# Patient Record
Sex: Male | Born: 1955 | Race: Black or African American | Hispanic: No | Smoking: Never smoker
Health system: Southern US, Community
[De-identification: ages and names within clinical notes are randomized; demographics above are authoritative.]

## PROBLEM LIST (undated history)

## (undated) DIAGNOSIS — E119 Type 2 diabetes mellitus without complications: Secondary | ICD-10-CM

## (undated) DIAGNOSIS — E785 Hyperlipidemia, unspecified: Secondary | ICD-10-CM

## (undated) DIAGNOSIS — I10 Essential (primary) hypertension: Secondary | ICD-10-CM

## (undated) DIAGNOSIS — I4891 Unspecified atrial fibrillation: Secondary | ICD-10-CM

## (undated) DIAGNOSIS — E669 Obesity, unspecified: Secondary | ICD-10-CM

## (undated) HISTORY — DX: Obesity, unspecified: E66.9

## (undated) HISTORY — PX: REPLACEMENT TOTAL KNEE: SUR1224

## (undated) HISTORY — DX: Essential (primary) hypertension: I10

---

## 2005-10-05 ENCOUNTER — Ambulatory Visit: Payer: Self-pay | Admitting: Gastroenterology

## 2006-05-01 ENCOUNTER — Ambulatory Visit: Payer: Self-pay | Admitting: Urology

## 2007-10-04 ENCOUNTER — Ambulatory Visit: Payer: Self-pay | Admitting: Urology

## 2007-10-25 ENCOUNTER — Ambulatory Visit: Payer: Self-pay | Admitting: Urology

## 2008-01-24 ENCOUNTER — Ambulatory Visit: Payer: Self-pay | Admitting: Urology

## 2008-03-21 ENCOUNTER — Encounter: Admission: RE | Admit: 2008-03-21 | Discharge: 2008-03-21 | Payer: Self-pay | Admitting: Orthopaedic Surgery

## 2008-04-11 ENCOUNTER — Ambulatory Visit: Payer: Self-pay | Admitting: Anesthesiology

## 2008-04-15 ENCOUNTER — Ambulatory Visit: Payer: Self-pay | Admitting: Anesthesiology

## 2008-06-19 ENCOUNTER — Ambulatory Visit: Payer: Self-pay | Admitting: Anesthesiology

## 2008-07-16 ENCOUNTER — Ambulatory Visit: Payer: Self-pay | Admitting: Anesthesiology

## 2008-08-29 ENCOUNTER — Ambulatory Visit: Payer: Self-pay | Admitting: Anesthesiology

## 2008-10-06 ENCOUNTER — Ambulatory Visit: Payer: Self-pay | Admitting: Unknown Physician Specialty

## 2008-10-08 ENCOUNTER — Ambulatory Visit: Payer: Self-pay | Admitting: Anesthesiology

## 2008-12-04 ENCOUNTER — Encounter: Payer: Self-pay | Admitting: Cardiovascular Disease

## 2008-12-05 ENCOUNTER — Ambulatory Visit: Payer: Self-pay | Admitting: Anesthesiology

## 2008-12-22 ENCOUNTER — Ambulatory Visit: Payer: Self-pay | Admitting: Cardiovascular Disease

## 2008-12-23 LAB — CONVERTED CEMR LAB
CO2: 21 meq/L (ref 19–32)
Calcium: 8.5 mg/dL (ref 8.4–10.5)
Chloride: 105 meq/L (ref 96–112)
Creatinine, Ser: 0.81 mg/dL (ref 0.40–1.50)
Hemoglobin: 14.1 g/dL (ref 13.0–17.0)
INR: 1.1 (ref 0.0–1.5)
Prothrombin Time: 14.3 s (ref 11.6–15.2)
RBC: 4.65 M/uL (ref 4.22–5.81)
Sodium: 137 meq/L (ref 135–145)

## 2008-12-25 ENCOUNTER — Ambulatory Visit: Payer: Self-pay | Admitting: Cardiovascular Disease

## 2008-12-25 ENCOUNTER — Ambulatory Visit (HOSPITAL_COMMUNITY): Admission: RE | Admit: 2008-12-25 | Discharge: 2008-12-25 | Payer: Self-pay | Admitting: Cardiovascular Disease

## 2009-07-22 ENCOUNTER — Ambulatory Visit: Payer: Self-pay | Admitting: Urology

## 2009-07-27 IMAGING — CR DG ABDOMEN 1V
1 series · 3 of 3 positions shown · non-contrast
Comparison: none

REASON FOR EXAM: nephrolithiasis pyuria pt need films
COMMENTS:

[Series 1: view not recorded · 0.17mm/px · 3 of 3 slices shown]
[im 1/3]
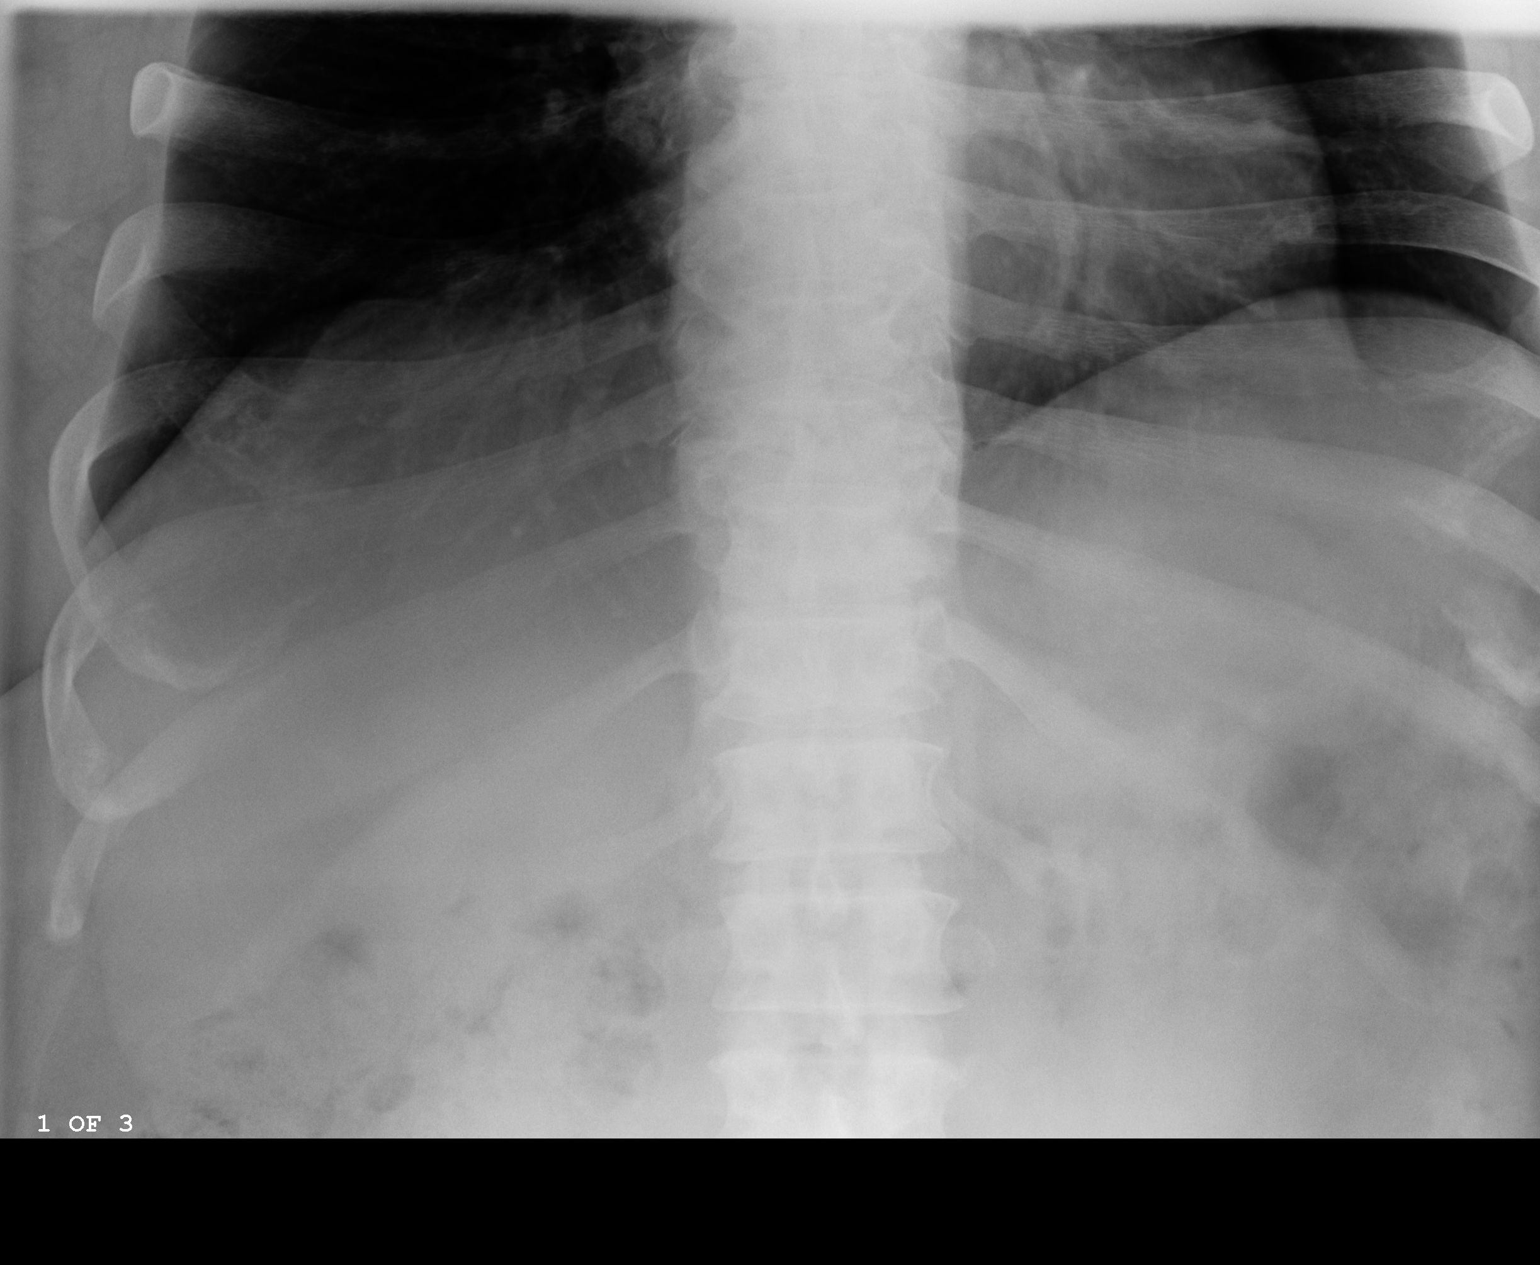
[im 2/3]
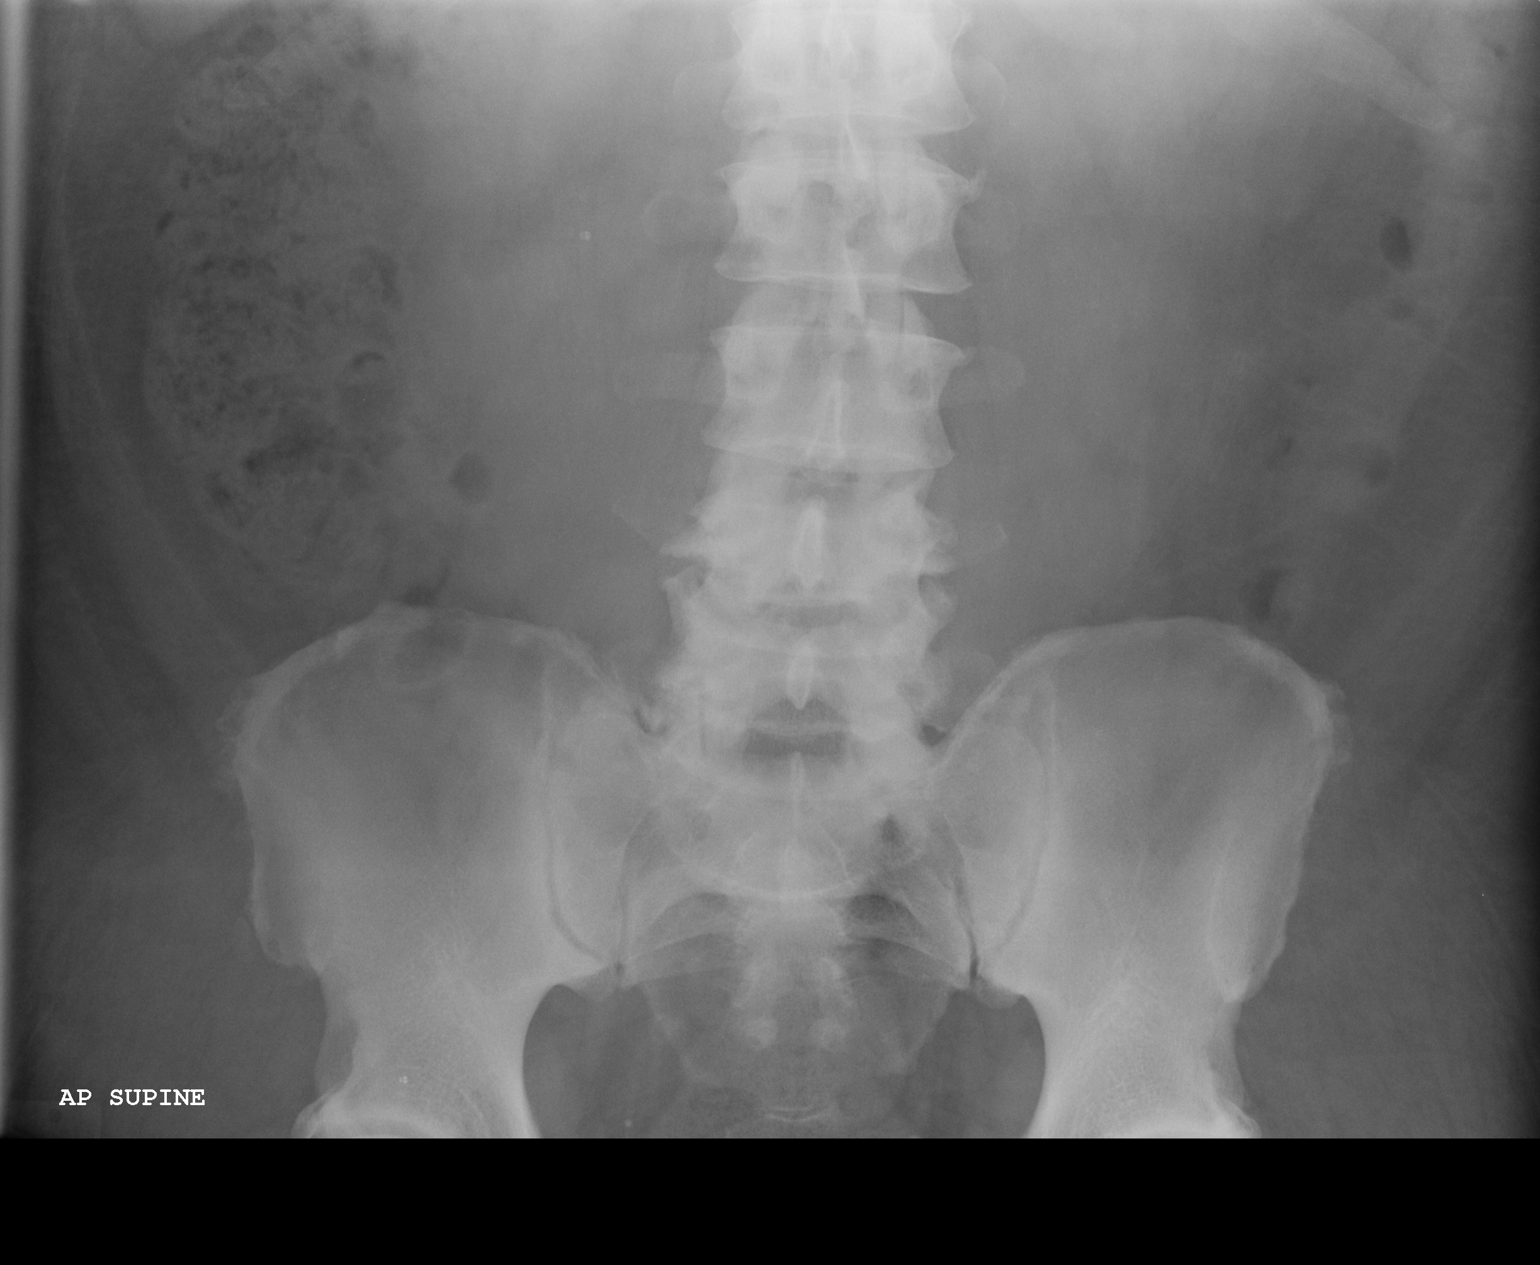
[im 3/3]
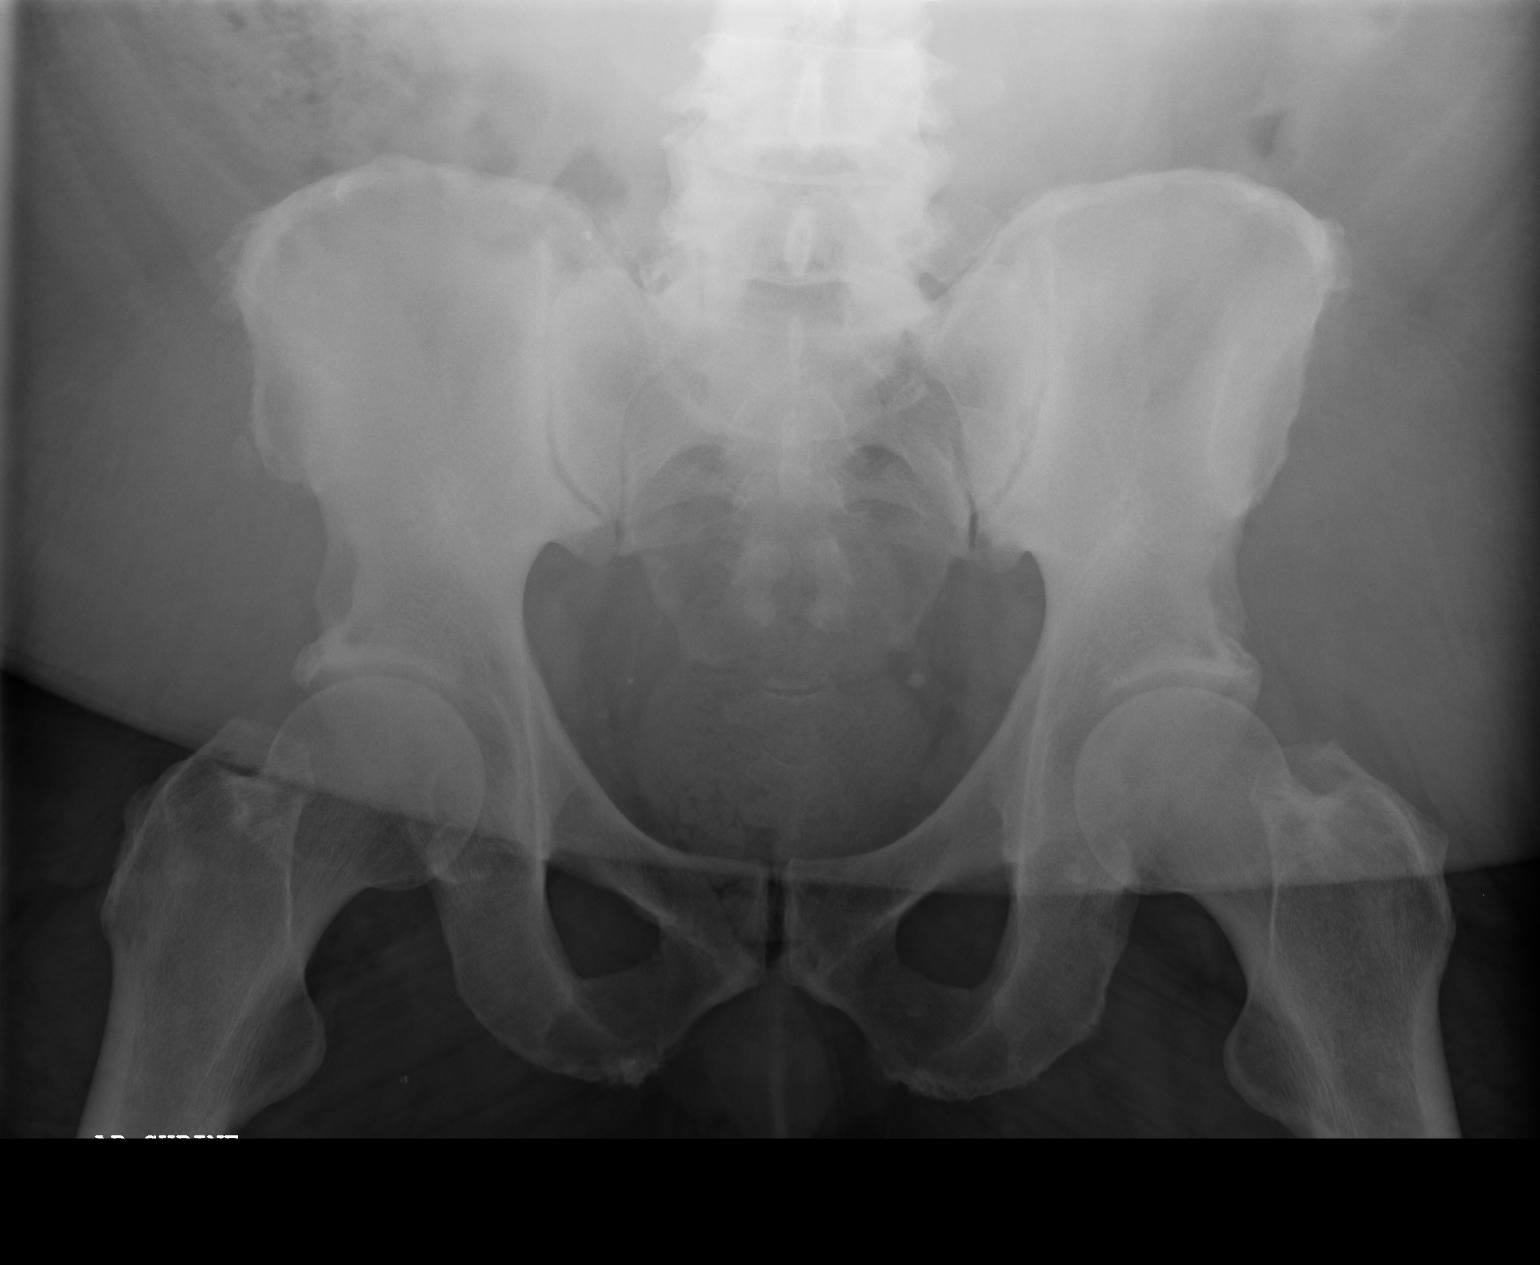

[3 of 3 positions shown; findings below may reference images not displayed]

PROCEDURE:     DXR - DXR KIDNEY URETER BLADDER  - October 04, 2007  [DATE]

RESULT:     There is a 2 mm calcific density projected at the level of the
L2 transverse process on the RIGHT.  The etiology for this density is
uncertain but a tiny ureteral stone cannot be totally excluded. There are
noted tiny calcifications in the pelvis bilaterally compatible with
phleboliths.  On plain film examination, no definite renal or ureteral
stones are seen.
IMPRESSION: 1.     No definite renal or ureteral stones are identified.
2.     There is a possible proximal RIGHT ureteral stone as mentioned above.

## 2009-09-21 ENCOUNTER — Ambulatory Visit: Payer: Self-pay | Admitting: Family Medicine

## 2010-05-18 ENCOUNTER — Ambulatory Visit: Payer: Self-pay | Admitting: Emergency Medicine

## 2010-07-28 ENCOUNTER — Ambulatory Visit: Payer: Self-pay | Admitting: Urology

## 2010-10-26 ENCOUNTER — Ambulatory Visit: Payer: Self-pay | Admitting: Specialist

## 2010-10-26 DIAGNOSIS — Z01810 Encounter for preprocedural cardiovascular examination: Secondary | ICD-10-CM

## 2010-10-28 ENCOUNTER — Ambulatory Visit: Payer: Self-pay | Admitting: Specialist

## 2010-11-08 ENCOUNTER — Ambulatory Visit: Payer: Self-pay | Admitting: Specialist

## 2010-11-14 ENCOUNTER — Ambulatory Visit: Payer: Self-pay | Admitting: Specialist

## 2010-11-15 ENCOUNTER — Ambulatory Visit: Payer: Self-pay | Admitting: Specialist

## 2010-12-28 NOTE — Letter (Signed)
Dec 22, 2008    Jacob Moss, MD  [facility]  [address], [address]  [place], [place] [address]   RE:  [name], [name]  MRN:  [id number]  /  DOB:  07-09-1956   Dear Dr. Dossie Arbour:   It was my pleasure to see Jacob Hicks on Dec 22, 2008, as an outpatient  for evaluation of chest pain.   Jacob Hicks is a 55 year old gentleman with no prior history of cardiac  disease.  He presents with a 35-month history of dizziness, diaphoresis,  and sharp chest pains that occur with walking.  His pain started out as  sharp and shooting.  They are in the middle of the chest with some  radiation to the back.  He then develops a tightness in the chest.  There is no radiation of pain to the jaw.  There is no associated nausea  or vomiting.  He reports profuse diaphoresis.  His symptoms resolve with  rest after approximately 5-15 minutes.  His symptoms are progressive  over a 47-month time period.  He also has a sensation that his heart is  racing.  He denies edema, orthopnea, PND, or syncope.   MEDICATIONS:  1. Benazepril 40 mg daily.  2. Hydrochlorothiazide 25 mg daily.  3. Ropinirole 25 mg t.i.d.  4. Gabapentin 300 mg t.i.d.  5. Allopurinol 300 mg daily.  6. Cyclobenzaprine 10 mg at bedtime.  7. Meloxicam 15 mg daily.  8. Fish oil 2 daily.  9. Mega Men vitamin 2 daily.  10.Aspirin 325 mg daily.   ALLERGIES:  NKDA.   PAST MEDICAL HISTORY:  1. Essential hypertension.  2. Gout.  3. Left knee arthroscopy in 2002.  4. Osteoarthritis.  5. Lumbar disk disease.   SOCIAL HISTORY:  The patient has been married for 32 years.  He has 2  grown children.  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  He has no  history of tobacco or alcohol use.  He does not participate in any  exercise.  He leads a sedentary lifestyle.   FAMILY HISTORY:  The patient's brother is deceased of a myocardial  infarction at  age 19.  There is no other coronary disease in the family.  There is no history of stroke in the family.  His brother also had end-  stage renal disease.   REVIEW OF SYSTEMS:  A complete 12-point review of systems was performed.  Pertinent positives included severe bilateral knee pain as well as low  back pain.  He reports that he will likely require bilateral knee  replacement as well as back surgery.  Review of systems also shows is  pertinent for a 45-pound weight loss.  The patient's chest pain has  actually been worse since he has lost weight by his report.  Positive  for seasonal allergies, chronic dyspnea, and leg pain with walking.  All  other systems are reviewed and are negative except as per the HPI.   PHYSICAL EXAMINATION:  GENERAL:  The patient is alert and oriented.  He  is morbidly obese.  VITAL SIGNS:  His weight is recorded at 415 pounds.  Blood pressure  128/78, heart rate 75, respiratory rate 16.  HEENT:  Normal.  NECK:  Normal carotid upstrokes.  No bruits.  JVP normal.  No  thyromegaly or thyroid nodules.  LUNGS:  Clear bilaterally.  HEART:  The apex is  not palpable.  The heart is regular rate and rhythm.  There are no murmurs or gallops.  BACK:  No CVA tenderness.  ABDOMEN:  Soft, nontender, obese.  No organomegaly.  No abdominal  bruits.  EXTREMITIES:  There is no clubbing, cyanosis, or edema.  Dorsalis pedis  pulses are 3+ bilaterally.  Posterior tibs are not palpable.  Radial  pulses are 2+ bilaterally.  There is normal Allen test in both hands  with radial artery occlusion.  SKIN:  Warm and dry without rash.  NEUROLOGIC:  Cranial nerves II through XII are intact.  Strength is  intact and equal.   EKG shows normal sinus rhythm and is within normal limits.   Lab data reviewed from December 04, 2008, shows a TSH of 1.3, triglycerides  75, potassium 3.8, platelet count 177,000, LDL 119, HDL 35, hemoglobin  14.5, glucose 109, creatinine 1.0   ASSESSMENT:  This  is a morbidly obese 55 year old gentleman with  progressive [place] Cardiovascular Society class III angina.  The  patient's symptoms are impressive and they are highly typical of angina.  Because of a high pretest probability and the inability to perform a  good stress test on the patient of this size, I would recommend  proceeding directly with cardiac catheterization.  I will likely use a  radial artery approach for access, because of his size.  The risks and  indications of the procedure were reviewed in detail with the patient.  He agrees to proceed.  This will be scheduled for later this week.  In  the interim, I would like him to continue with aspirin.  He was given a  prescription for nitroglycerin.  He should continue on his other  medications without change.  1. Hypertension.  Blood pressure under good control on a combination      of benazepril and hydrochlorothiazide.  We will assess his coronary      status in a few days and likely will start him on a beta-blocker at      that time.  2. Hyperlipidemia.  If he turns out to have coronary artery disease,      he will be above goal with an LDL greater than 100 and an HDL less      than 40.  We will further risk stratify based on his findings at      cardiac catheterization.   The patient is scheduled to have a colonoscopy for polyp surveillance in  2 days.  I spoke with his gastroenterologist, Dr. Servando Snare.  I this will be  deferred until we better assess his coronary status.  There is no  urgency for the colonoscopy since the recommendation is apparently 3-5  years surveillance.   Thank you again for asking me to see this very nice gentleman.  I will  be in contact at the time of his heart catheterization.    Sincerely,      Veverly Fells. Excell Seltzer, MD  Electronically Signed    MDC/MedQ  DD: 12/22/2008  DT: 12/23/2008  Job #: [id number]   CC:    Midge Minium, MD

## 2011-01-31 ENCOUNTER — Encounter: Payer: Self-pay | Admitting: Cardiovascular Disease

## 2012-05-08 ENCOUNTER — Encounter (HOSPITAL_COMMUNITY): Payer: Self-pay | Admitting: *Deleted

## 2012-05-08 ENCOUNTER — Emergency Department (HOSPITAL_COMMUNITY)
Admission: EM | Admit: 2012-05-08 | Discharge: 2012-05-09 | Disposition: A | Discharge status: Home / Self Care | Payer: Medicare Other | Attending: Emergency Medicine | Admitting: Emergency Medicine

## 2012-05-08 DIAGNOSIS — I10 Essential (primary) hypertension: Secondary | ICD-10-CM | POA: Insufficient documentation

## 2012-05-08 DIAGNOSIS — S61209A Unspecified open wound of unspecified finger without damage to nail, initial encounter: Secondary | ICD-10-CM | POA: Insufficient documentation

## 2012-05-08 DIAGNOSIS — S6990XA Unspecified injury of unspecified wrist, hand and finger(s), initial encounter: Secondary | ICD-10-CM

## 2012-05-08 DIAGNOSIS — Z8249 Family history of ischemic heart disease and other diseases of the circulatory system: Secondary | ICD-10-CM | POA: Insufficient documentation

## 2012-05-08 DIAGNOSIS — M109 Gout, unspecified: Secondary | ICD-10-CM | POA: Insufficient documentation

## 2012-05-08 DIAGNOSIS — Z800 Family history of malignant neoplasm of digestive organs: Secondary | ICD-10-CM | POA: Insufficient documentation

## 2012-05-08 MED ORDER — TETANUS-DIPHTH-ACELL PERTUSSIS 5-2.5-18.5 LF-MCG/0.5 IM SUSP
0.5000 mL | Freq: Once | INTRAMUSCULAR | Status: AC
  Administered 2012-05-09: 0.5 mL via INTRAMUSCULAR
  Filled 2012-05-08: qty 0.5

## 2012-05-08 NOTE — ED Notes (Signed)
NAD. Patient requesting tetanus vaccine

## 2012-05-08 NOTE — ED Notes (Signed)
Patient was reaching for something at the store and his finger hit the counter.  The counter injured his left thumb, small laceration to his nail.  Patient is requesting a tetatus shot

## 2012-05-08 NOTE — ED Notes (Signed)
End of left thumb with broken nail exposing nail bed. Cleaned and dressed

## 2012-05-09 NOTE — ED Provider Notes (Signed)
Medical screening examination/treatment/procedure(s) were performed by non-physician practitioner and as supervising physician I was immediately available for consultation/collaboration.    Jori Frerichs D Kadyn Guild, MD 05/09/12 1608 

## 2012-05-09 NOTE — ED Provider Notes (Signed)
History     CSN: [id number]  Arrival date & time 05/08/12  2103   First MD Initiated Contact with Patient 05/08/12 2228      Chief Complaint  Patient presents with  . Finger Injury   HPI  History provided by the patient. Patient is a 56 year old male with history of hypertension who presents with complaints of left thumb injury. Patient states he was at [employer] earlier today and was reaching for the counter to give change when he hit his left thumb against a sharp metal piece of the counter edge. This caused a small chip of his fingernail and bleeding under the nail. Patient was concerned because this was a metal object that was dirty in the restaurant and his tetanus shot is not up-to-date. He reports only a small amount of bleeding that was controlled with pressure and bandage. He denies any decreased range of motion or numbness to the thumb.   Past Medical History  Diagnosis Date  . Hypertension   . Gout   . Obesity     Morbid    History reviewed. No pertinent past surgical history.  Family History  Problem Relation Age of Onset  . Cancer Mother     Pancreatic  . Heart attack Brother   . Hypertension    . Other Brother     kidney/dialysis    History  Substance Use Topics  . Smoking status: Never Smoker   . Smokeless tobacco: Not on file  . Alcohol Use: No      Review of Systems  Neurological: Negative for weakness and numbness.    Allergies  Review of patient's allergies indicates no known allergies.  Home Medications   Current Outpatient Rx  Name Route Sig Dispense Refill  . ALLOPURINOL 300 MG PO TABS Oral Take 300 mg by mouth daily.      . ASPIRIN 325 MG PO TBEC Oral Take 325 mg by mouth daily.      Marland Kitchen BENAZEPRIL HCL 40 MG PO TABS Oral Take 40 mg by mouth daily.      Marland Kitchen GABAPENTIN 300 MG PO CAPS Oral Take 300 mg by mouth 2 (two) times daily.     . MELOXICAM 15 MG PO TABS Oral Take 15 mg by mouth daily.      Marland Kitchen MEGA MULTIVITAMIN FOR MEN PO TABS Oral  Take 1 tablet by mouth daily.     Marland Kitchen FISH OIL MAXIMUM STRENGTH 1200 MG PO CAPS Oral Take 1 capsule by mouth daily.       BP 141/73  Pulse 64  Temp 98.1 F (36.7 C) (Oral)  Resp 16  SpO2 97%  Physical Exam  Nursing note and vitals reviewed. Constitutional: He is oriented to person, place, and time. He appears well-developed and well-nourished. No distress.  HENT:  Head: Normocephalic.  Cardiovascular: Normal rate and regular rhythm.   Pulmonary/Chest: Effort normal and breath sounds normal. No respiratory distress.  Musculoskeletal: Normal range of motion. He exhibits no edema.       Small split of the left thumbnail with partial avulsion of. There is a small laceration to the distal tip of finger under the nail.  Neurological: He is alert and oriented to person, place, and time.  Skin: Skin is warm.  Psychiatric: He has a normal mood and affect. His behavior is normal.    ED Course  Procedures     1. Injury to thumb (nail)       MDM  12:00AM patient seen  and evaluated.        Angus Seller, [place] 05/09/12 [phone]

## 2017-08-10 DIAGNOSIS — M48061 Spinal stenosis, lumbar region without neurogenic claudication: Secondary | ICD-10-CM | POA: Insufficient documentation

## 2017-08-10 DIAGNOSIS — M47816 Spondylosis without myelopathy or radiculopathy, lumbar region: Secondary | ICD-10-CM | POA: Insufficient documentation

## 2017-08-10 DIAGNOSIS — G894 Chronic pain syndrome: Secondary | ICD-10-CM | POA: Insufficient documentation

## 2017-10-02 DIAGNOSIS — I480 Paroxysmal atrial fibrillation: Secondary | ICD-10-CM | POA: Insufficient documentation

## 2017-10-02 DIAGNOSIS — I10 Essential (primary) hypertension: Secondary | ICD-10-CM | POA: Insufficient documentation

## 2017-10-02 DIAGNOSIS — Z8249 Family history of ischemic heart disease and other diseases of the circulatory system: Secondary | ICD-10-CM | POA: Insufficient documentation

## 2018-05-07 DIAGNOSIS — M549 Dorsalgia, unspecified: Secondary | ICD-10-CM | POA: Insufficient documentation

## 2018-05-22 DIAGNOSIS — M545 Low back pain: Secondary | ICD-10-CM | POA: Diagnosis not present

## 2018-05-22 DIAGNOSIS — M1990 Unspecified osteoarthritis, unspecified site: Secondary | ICD-10-CM | POA: Diagnosis not present

## 2018-05-22 DIAGNOSIS — Z96612 Presence of left artificial shoulder joint: Secondary | ICD-10-CM | POA: Diagnosis not present

## 2018-05-22 DIAGNOSIS — M48061 Spinal stenosis, lumbar region without neurogenic claudication: Secondary | ICD-10-CM | POA: Diagnosis not present

## 2018-05-22 DIAGNOSIS — I10 Essential (primary) hypertension: Secondary | ICD-10-CM | POA: Diagnosis not present

## 2018-05-22 DIAGNOSIS — M47817 Spondylosis without myelopathy or radiculopathy, lumbosacral region: Secondary | ICD-10-CM | POA: Diagnosis not present

## 2018-05-22 DIAGNOSIS — Z96653 Presence of artificial knee joint, bilateral: Secondary | ICD-10-CM | POA: Diagnosis not present

## 2018-05-22 DIAGNOSIS — G894 Chronic pain syndrome: Secondary | ICD-10-CM | POA: Diagnosis not present

## 2018-06-04 DIAGNOSIS — M79604 Pain in right leg: Secondary | ICD-10-CM | POA: Diagnosis not present

## 2018-06-04 DIAGNOSIS — R0989 Other specified symptoms and signs involving the circulatory and respiratory systems: Secondary | ICD-10-CM | POA: Diagnosis not present

## 2018-06-04 DIAGNOSIS — I4891 Unspecified atrial fibrillation: Secondary | ICD-10-CM | POA: Diagnosis not present

## 2018-06-04 DIAGNOSIS — R000 Tachycardia, unspecified: Secondary | ICD-10-CM | POA: Diagnosis not present

## 2018-06-04 DIAGNOSIS — R200 Anesthesia of skin: Secondary | ICD-10-CM | POA: Diagnosis not present

## 2018-06-04 DIAGNOSIS — E7800 Pure hypercholesterolemia, unspecified: Secondary | ICD-10-CM | POA: Diagnosis not present

## 2018-06-04 DIAGNOSIS — I10 Essential (primary) hypertension: Secondary | ICD-10-CM | POA: Diagnosis not present

## 2018-06-04 DIAGNOSIS — Z7689 Persons encountering health services in other specified circumstances: Secondary | ICD-10-CM | POA: Diagnosis not present

## 2018-06-15 DIAGNOSIS — L600 Ingrowing nail: Secondary | ICD-10-CM | POA: Diagnosis not present

## 2018-07-16 DIAGNOSIS — G4700 Insomnia, unspecified: Secondary | ICD-10-CM | POA: Diagnosis not present

## 2018-07-16 DIAGNOSIS — Z6841 Body Mass Index (BMI) 40.0 and over, adult: Secondary | ICD-10-CM | POA: Diagnosis not present

## 2018-07-16 DIAGNOSIS — Z79899 Other long term (current) drug therapy: Secondary | ICD-10-CM | POA: Diagnosis not present

## 2018-07-16 DIAGNOSIS — Z7689 Persons encountering health services in other specified circumstances: Secondary | ICD-10-CM | POA: Diagnosis not present

## 2018-07-16 DIAGNOSIS — Z5181 Encounter for therapeutic drug level monitoring: Secondary | ICD-10-CM | POA: Diagnosis not present

## 2018-07-16 DIAGNOSIS — I70202 Unspecified atherosclerosis of native arteries of extremities, left leg: Secondary | ICD-10-CM | POA: Diagnosis not present

## 2018-07-16 DIAGNOSIS — I10 Essential (primary) hypertension: Secondary | ICD-10-CM | POA: Diagnosis not present

## 2018-07-16 DIAGNOSIS — R008 Other abnormalities of heart beat: Secondary | ICD-10-CM | POA: Diagnosis not present

## 2018-07-16 DIAGNOSIS — M48062 Spinal stenosis, lumbar region with neurogenic claudication: Secondary | ICD-10-CM | POA: Diagnosis not present

## 2018-07-19 ENCOUNTER — Encounter: Payer: Self-pay | Admitting: Family Medicine

## 2018-07-26 DIAGNOSIS — B079 Viral wart, unspecified: Secondary | ICD-10-CM | POA: Diagnosis not present

## 2018-08-17 DIAGNOSIS — Z1211 Encounter for screening for malignant neoplasm of colon: Secondary | ICD-10-CM | POA: Diagnosis not present

## 2018-09-03 ENCOUNTER — Ambulatory Visit: Payer: PPO | Admitting: Family Medicine

## 2018-09-03 DIAGNOSIS — Z0289 Encounter for other administrative examinations: Secondary | ICD-10-CM

## 2018-10-02 ENCOUNTER — Encounter: Payer: Self-pay | Admitting: Family Medicine

## 2018-10-25 ENCOUNTER — Other Ambulatory Visit: Payer: Self-pay | Admitting: Internal Medicine

## 2018-10-25 DIAGNOSIS — R319 Hematuria, unspecified: Secondary | ICD-10-CM

## 2018-10-31 ENCOUNTER — Other Ambulatory Visit: Payer: PPO

## 2018-11-06 ENCOUNTER — Inpatient Hospital Stay: Admission: RE | Admit: 2018-11-06 | Payer: Medicare Other | Source: Ambulatory Visit

## 2018-12-19 ENCOUNTER — Ambulatory Visit (HOSPITAL_COMMUNITY)
Admission: RE | Admit: 2018-12-19 | Discharge: 2018-12-19 | Disposition: A | Discharge status: Home / Self Care | Payer: Medicare Other | Source: Ambulatory Visit | Attending: Internal Medicine | Admitting: Internal Medicine

## 2018-12-19 ENCOUNTER — Other Ambulatory Visit: Payer: Self-pay

## 2018-12-19 DIAGNOSIS — R319 Hematuria, unspecified: Secondary | ICD-10-CM | POA: Insufficient documentation

## 2018-12-19 MED ORDER — IOHEXOL 300 MG/ML  SOLN
100.0000 mL | Freq: Once | INTRAMUSCULAR | Status: AC | PRN
  Administered 2018-12-19: 15:00:00 100 mL via INTRAVENOUS

## 2019-01-14 DEATH — deceased

## 2019-10-11 ENCOUNTER — Ambulatory Visit: Payer: Medicare Other | Attending: Internal Medicine

## 2019-10-11 DIAGNOSIS — Z23 Encounter for immunization: Secondary | ICD-10-CM | POA: Insufficient documentation

## 2019-10-11 NOTE — Progress Notes (Signed)
   Covid-19 Vaccination Clinic  Name:  Jacob Hicks    MRN: [id number] DOB: Mar 17, 1956  10/11/2019  [name] was observed post Covid-19 immunization for 15 minutes without incidence. He was provided with Vaccine Information Sheet and instruction to access the V-Safe system.   [name] was instructed to call 911 with any severe reactions post vaccine: Marland Kitchen Difficulty breathing  . Swelling of your face and throat  . A fast heartbeat  . A bad rash all over your body  . Dizziness and weakness    Immunizations Administered    Name Date Dose VIS Date Route   Pfizer COVID-19 Vaccine 10/11/2019 10:59 AM 0.3 mL 07/26/2019 Intramuscular   Manufacturer: [employer], [employer]   Lot: [id number]   NDC: [address]-7

## 2019-11-05 ENCOUNTER — Ambulatory Visit: Payer: Medicare Other | Attending: Internal Medicine

## 2019-11-05 DIAGNOSIS — Z23 Encounter for immunization: Secondary | ICD-10-CM

## 2019-11-05 NOTE — Progress Notes (Signed)
   Covid-19 Vaccination Clinic  Name:  KAYDIN KARBOWSKI    MRN: [id number] DOB: 1955/11/20  11/05/2019  Mr. Duross was observed post Covid-19 immunization for 15 minutes without incident. He was provided with Vaccine Information Sheet and instruction to access the V-Safe system.   Mr. Vanatta was instructed to call 911 with any severe reactions post vaccine: Marland Kitchen Difficulty breathing  . Swelling of face and throat  . A fast heartbeat  . A bad rash all over body  . Dizziness and weakness   Immunizations Administered    Name Date Dose VIS Date Route   Pfizer COVID-19 Vaccine 11/05/2019  1:03 PM 0.3 mL 07/26/2019 Intramuscular   Manufacturer: [employer], [employer]   Lot: [id number]   NDC: [address]-4

## 2020-11-10 ENCOUNTER — Other Ambulatory Visit: Payer: Self-pay | Admitting: Orthopaedic Surgery

## 2020-11-10 ENCOUNTER — Other Ambulatory Visit (HOSPITAL_COMMUNITY): Payer: Self-pay | Admitting: Orthopaedic Surgery

## 2020-11-10 DIAGNOSIS — M25562 Pain in left knee: Secondary | ICD-10-CM

## 2020-11-10 DIAGNOSIS — Z96652 Presence of left artificial knee joint: Secondary | ICD-10-CM

## 2020-11-18 ENCOUNTER — Other Ambulatory Visit: Payer: Self-pay

## 2020-11-18 ENCOUNTER — Encounter (HOSPITAL_COMMUNITY)
Admission: RE | Admit: 2020-11-18 | Discharge: 2020-11-18 | Disposition: A | Discharge status: Home / Self Care | Payer: Medicare HMO | Source: Ambulatory Visit | Attending: Orthopaedic Surgery | Admitting: Orthopaedic Surgery

## 2020-11-18 DIAGNOSIS — Z96652 Presence of left artificial knee joint: Secondary | ICD-10-CM | POA: Insufficient documentation

## 2020-11-18 DIAGNOSIS — M25562 Pain in left knee: Secondary | ICD-10-CM | POA: Insufficient documentation

## 2020-11-18 MED ORDER — TECHNETIUM TC 99M MEDRONATE IV KIT
20.0000 | PACK | Freq: Once | INTRAVENOUS | Status: AC | PRN
  Administered 2020-11-18: 20 via INTRAVENOUS

## 2021-01-13 ENCOUNTER — Other Ambulatory Visit: Payer: Self-pay | Admitting: Orthopaedic Surgery

## 2021-01-13 DIAGNOSIS — M5416 Radiculopathy, lumbar region: Secondary | ICD-10-CM

## 2021-01-23 ENCOUNTER — Other Ambulatory Visit: Payer: Self-pay

## 2021-01-23 ENCOUNTER — Ambulatory Visit
Admission: RE | Admit: 2021-01-23 | Discharge: 2021-01-23 | Disposition: A | Discharge status: Home / Self Care | Payer: Medicare HMO | Source: Ambulatory Visit | Attending: Orthopaedic Surgery | Admitting: Orthopaedic Surgery

## 2021-01-23 DIAGNOSIS — M5416 Radiculopathy, lumbar region: Secondary | ICD-10-CM

## 2021-08-06 ENCOUNTER — Emergency Department (HOSPITAL_COMMUNITY): Payer: Medicare HMO

## 2021-08-06 ENCOUNTER — Other Ambulatory Visit: Payer: Self-pay

## 2021-08-06 ENCOUNTER — Emergency Department (HOSPITAL_BASED_OUTPATIENT_CLINIC_OR_DEPARTMENT_OTHER)
Admission: EM | Admit: 2021-08-06 | Discharge: 2021-08-06 | Disposition: A | Discharge status: Home / Self Care | Payer: Medicare HMO | Attending: Emergency Medicine | Admitting: Emergency Medicine

## 2021-08-06 ENCOUNTER — Encounter (HOSPITAL_BASED_OUTPATIENT_CLINIC_OR_DEPARTMENT_OTHER): Payer: Self-pay

## 2021-08-06 ENCOUNTER — Emergency Department (HOSPITAL_BASED_OUTPATIENT_CLINIC_OR_DEPARTMENT_OTHER): Payer: Medicare HMO

## 2021-08-06 DIAGNOSIS — Z7982 Long term (current) use of aspirin: Secondary | ICD-10-CM | POA: Insufficient documentation

## 2021-08-06 DIAGNOSIS — Z79899 Other long term (current) drug therapy: Secondary | ICD-10-CM | POA: Diagnosis not present

## 2021-08-06 DIAGNOSIS — Z96653 Presence of artificial knee joint, bilateral: Secondary | ICD-10-CM | POA: Insufficient documentation

## 2021-08-06 DIAGNOSIS — E119 Type 2 diabetes mellitus without complications: Secondary | ICD-10-CM | POA: Diagnosis not present

## 2021-08-06 DIAGNOSIS — R42 Dizziness and giddiness: Secondary | ICD-10-CM | POA: Diagnosis not present

## 2021-08-06 DIAGNOSIS — R001 Bradycardia, unspecified: Secondary | ICD-10-CM | POA: Insufficient documentation

## 2021-08-06 DIAGNOSIS — I10 Essential (primary) hypertension: Secondary | ICD-10-CM | POA: Insufficient documentation

## 2021-08-06 HISTORY — DX: Type 2 diabetes mellitus without complications: E11.9

## 2021-08-06 HISTORY — DX: Hyperlipidemia, unspecified: E78.5

## 2021-08-06 HISTORY — DX: Unspecified atrial fibrillation: I48.91

## 2021-08-06 LAB — COMPREHENSIVE METABOLIC PANEL
ALT: 10 U/L (ref 0–44)
AST: 13 U/L — ABNORMAL LOW (ref 15–41)
Albumin: 3.5 g/dL (ref 3.5–5.0)
Alkaline Phosphatase: 66 U/L (ref 38–126)
Anion gap: 7 (ref 5–15)
BUN: 12 mg/dL (ref 8–23)
CO2: 27 mmol/L (ref 22–32)
Calcium: 8.8 mg/dL — ABNORMAL LOW (ref 8.9–10.3)
Chloride: 105 mmol/L (ref 98–111)
Creatinine, Ser: 0.79 mg/dL (ref 0.61–1.24)
GFR, Estimated: >60 mL/min (ref >60)
Glucose, Bld: 93 mg/dL (ref 70–99)
Potassium: 3.9 mmol/L (ref 3.5–5.1)
Sodium: 139 mmol/L (ref 135–145)
Total Bilirubin: 0.4 mg/dL (ref 0.3–1.2)
Total Protein: 7.1 g/dL (ref 6.5–8.1)

## 2021-08-06 LAB — CBC WITH DIFFERENTIAL/PLATELET
Abs Immature Granulocytes: 0.01 10*3/uL (ref 0.00–0.07)
Basophils Absolute: 0 10*3/uL (ref 0.0–0.1)
Basophils Relative: 1 %
Eosinophils Absolute: 0.1 10*3/uL (ref 0.0–0.5)
Eosinophils Relative: 2 %
HCT: 40.8 % (ref 39.0–52.0)
Hemoglobin: 13.8 g/dL (ref 13.0–17.0)
Immature Granulocytes: 0 %
Lymphocytes Relative: 21 %
Lymphs Abs: 1.3 10*3/uL (ref 0.7–4.0)
MCH: 30.9 pg (ref 26.0–34.0)
MCHC: 33.8 g/dL (ref 30.0–36.0)
MCV: 91.5 fL (ref 80.0–100.0)
Monocytes Absolute: 0.4 10*3/uL (ref 0.1–1.0)
Monocytes Relative: 7 %
Neutro Abs: 4.5 10*3/uL (ref 1.7–7.7)
Neutrophils Relative %: 69 %
Platelets: 132 10*3/uL — ABNORMAL LOW (ref 150–400)
RBC: 4.46 MIL/uL (ref 4.22–5.81)
RDW: 15.1 % (ref 11.5–15.5)
WBC: 6.4 10*3/uL (ref 4.0–10.5)
nRBC: 0 % (ref 0.0–0.2)

## 2021-08-06 MED ORDER — MECLIZINE HCL 25 MG PO TABS
25.0000 mg | ORAL_TABLET | Freq: Once | ORAL | Status: AC
  Administered 2021-08-06: 17:00:00 25 mg via ORAL
  Filled 2021-08-06: qty 1

## 2021-08-06 NOTE — ED Provider Notes (Signed)
[facility] EMERGENCY DEPARTMENT Provider Note   CSN: [id number] Arrival date & time: 08/06/21  1329     History Chief Complaint  Patient presents with   Nausea    Jacob Hicks is a 65 y.o. male with history of atrial fibrillation anticoagulated with Eliquis and hypertension who presents to the emergency department with dizziness that began upon waking up this morning.  Patient describes his dizziness as a lightheaded sensation which has been constant but improving since onset.  Patient does state that he feels unsteady on his feet.  Patient has not taken anything for his dizziness.  It is worse with certain positional movements and with walking. He has not missed any doses of his Eliquis.  He denies any trouble speaking, trouble swallowing, weakness/numbness to his upper or lower extremities, facial droop, chest pain, shortness of breath, abdominal pain, nausea, vomiting, diarrhea.  No urinary complaints.  Patient was seen and evaluated at [facility] emergency department where he had basic labs and a CT head which is negative.  He was then transferred to [facility] for further evaluation and MRI of the brain to rule out possible stroke.  Patient states he is currently feeling better.  HPI     Past Medical History:  Diagnosis Date   Atrial fibrillation (HCC)    Diabetes mellitus without complication (HCC)    Gout    Hyperlipemia    Hypertension    Obesity    Morbid    There are no problems to display for this patient.   Past Surgical History:  Procedure Laterality Date   REPLACEMENT TOTAL KNEE Bilateral        Family History  Problem Relation Age of Onset   Cancer Mother        Pancreatic   Heart attack Brother    Hypertension Other    Other Brother        kidney/dialysis    Social History   Tobacco Use   Smoking status: Never  Substance Use Topics   Alcohol use: No    Home Medications Prior to Admission medications    Medication Sig Start Date End Date Taking? Authorizing Provider  allopurinol (ZYLOPRIM) 300 MG tablet Take 300 mg by mouth daily.      [provider]  aspirin 325 MG EC tablet Take 325 mg by mouth daily.      [provider]  benazepril (LOTENSIN) 40 MG tablet Take 40 mg by mouth daily.      [provider]  gabapentin (NEURONTIN) 300 MG capsule Take 300 mg by mouth 2 (two) times daily.     [provider]  meloxicam (MOBIC) 15 MG tablet Take 15 mg by mouth daily.      [provider]  Multiple Vitamins-Minerals (MEGA MULTIVITAMIN FOR MEN) TABS Take 1 tablet by mouth daily.     [provider]  Omega-3 Fatty Acids (FISH OIL MAXIMUM STRENGTH) 1200 MG CAPS Take 1 capsule by mouth daily.     [provider]    Allergies    Patient has no known allergies.  Review of Systems   Review of Systems  All other systems reviewed and are negative.  Physical Exam Updated Vital Signs BP 121/70    Pulse (!) 45    Temp 98 F (36.7 C) (Oral)    Resp 17    Ht 6\' 1"  (1.854 m)    Wt (!) 163.3 kg    SpO2 100%  BMI 47.50 kg/m   Physical Exam Vitals and nursing note reviewed.  Constitutional:      General: He is not in acute distress.    Appearance: Normal appearance.  HENT:     Head: Normocephalic and atraumatic.  Eyes:     General:        Right eye: No discharge.        Left eye: No discharge.     Extraocular Movements: Extraocular movements intact.     Right eye: Normal extraocular motion and no nystagmus.     Left eye: Normal extraocular motion and no nystagmus.  Cardiovascular:     Comments: Bradycardic. S1/S2 are distinct without any evidence of murmur, rubs, or gallops.  Radial pulses are 2+ bilaterally.  Dorsalis pedis pulses are 2+ bilaterally.  No evidence of pedal edema. Pulmonary:     Comments: Clear to auscultation bilaterally.  Normal effort.  No respiratory distress.  No evidence of wheezes, rales, or rhonchi heard  throughout. Abdominal:     General: Abdomen is flat. Bowel sounds are normal. There is no distension.     Tenderness: There is no abdominal tenderness. There is no guarding or rebound.  Musculoskeletal:        General: Normal range of motion.     Cervical back: Neck supple.  Skin:    General: Skin is warm and dry.     Findings: No rash.  Neurological:     General: No focal deficit present.     Mental Status: He is alert.     GCS: GCS eye subscore is 4. GCS verbal subscore is 5. GCS motor subscore is 6.     Comments: Cranial nerves II through XII are intact.  5/5 strength to the upper and lower extremities.  Normal sensation to the upper and lower extremities.  No dysmetria on finger-to-nose.  Negative Romberg.  No pronator drift.  Patient did seem a little unsteady on his feet while I tested his gait.  Patient does state this is his baseline.  Psychiatric:        Mood and Affect: Mood normal.        Behavior: Behavior normal.    ED Results / Procedures / Treatments   Labs (all labs ordered are listed, but only abnormal results are displayed) Labs Reviewed  CBC WITH DIFFERENTIAL/PLATELET - Abnormal; Notable for the following components:      Result Value   Platelets 132 (*)    All other components within normal limits  COMPREHENSIVE METABOLIC PANEL - Abnormal; Notable for the following components:   Calcium 8.8 (*)    AST 13 (*)    All other components within normal limits    EKG EKG Interpretation  Date/Time:  Friday August 06 2021 16:39:58 EST Ventricular Rate:  51 PR Interval:  164 QRS Duration: 106 QT Interval:  458 QTC Calculation: 422 R Axis:   -5 Text Interpretation: Sinus rhythm Borderline T abnormalities, inferior leads No significant change since last tracing Confirmed by Vanetta Mulders [phone]) on 08/06/2021 4:46:35 PM  Radiology CT Head Wo Contrast  Result Date: 08/06/2021 CLINICAL DATA:  Nausea low energy level EXAM: CT HEAD WITHOUT CONTRAST TECHNIQUE:  Contiguous axial images were obtained from the base of the skull through the vertex without intravenous contrast. COMPARISON:  CT brain report 12/03/1999 FINDINGS: Brain: No acute territorial infarction, hemorrhage or intracranial mass. The ventricles are nonenlarged. Vascular: No hyperdense vessels. Vertebral and carotid vascular calcification Skull: Normal. Negative for fracture or focal lesion.  Sinuses/Orbits: No acute finding. Other: None IMPRESSION: Negative non contrasted CT appearance of the brain Electronically Signed   By: Jasmine Pang M.D.   On: 08/06/2021 17:01   MR Brain Wo Contrast (neuro protocol)  Result Date: 08/06/2021 CLINICAL DATA:  Acute neurologic deficit EXAM: MRI HEAD WITHOUT CONTRAST TECHNIQUE: Multiplanar, multiecho pulse sequences of the brain and surrounding structures were obtained without intravenous contrast. COMPARISON:  None. FINDINGS: Brain: Punctate focus of hyperintensity on diffusion-weighted imaging in the medial right temporal lobe. No clear ADC correlate. No other diffusion abnormality. No acute or chronic hemorrhage. Normal white matter signal, parenchymal volume and CSF spaces. The midline structures are normal. Vascular: Major flow voids are preserved. Skull and upper cervical spine: Normal calvarium and skull base. Visualized upper cervical spine and soft tissues are normal. Sinuses/Orbits:No paranasal sinus fluid levels or advanced mucosal thickening. No mastoid or middle ear effusion. Normal orbits. IMPRESSION: 1. Punctate focus of hyperintensity on diffusion-weighted imaging in the medial right temporal lobe without clear ADC correlate. This may be a small focus of subacute ischemia. 2. Otherwise normal brain MRI. Electronically Signed   By: Deatra Robinson M.D.   On: 08/06/2021 22:30    Procedures Procedures   Medications Ordered in ED Medications  meclizine (ANTIVERT) tablet 25 mg (25 mg Oral Given 08/06/21 1707)    ED Course  I have reviewed the triage  vital signs and the nursing notes.  Pertinent labs & imaging results that were available during my care of the patient were reviewed by me and considered in my medical decision making (see chart for details).  Clinical Course as of 08/06/21 4098  Fri Aug 06, 2021  2249 Spoke with Dr. Derry Lory who recommends follow-up in the outpatient setting.  The findings on the MRI do not correlate with the patient's symptoms.  This is likely an incidental finding.  He recommends close follow-up in the outpatient setting and to continue his anticoagulation. [CF]    Clinical Course User Index [CF] Jolyn Lent   MDM Rules/Calculators/A&P                          AMAAR OSHITA is a 65 y.o. male who presents to the emergency department for rule out stroke.  CT head and basic labs were ordered prior to the patient's arrival to [facility] emergency department.  On my exam, patient has a relatively normal neurological exam but given his risk factors we will proceed with MRI of the brain.  Patient states that he currently feels back to his baseline  Labs were unremarkable.  CT head was unremarkable.  MRI did show a small possible ischemic change in the right temporal lobe.  Spoke with neurology who does not believe this is related to his symptoms.  At this point it is unclear what exactly is causing his dizziness but patient is currently returned to his baseline.  I will have him follow-up with neurology in the outpatient setting.  Strict turn precautions given.  He is safe for discharge.   Final Clinical Impression(s) / ED Diagnoses Final diagnoses:  Dizziness    Rx / DC Orders ED Discharge Orders     None        Jolyn Lent 08/06/21 2302    Cheryll Cockayne, MD 08/12/21 2322

## 2021-08-06 NOTE — ED Provider Notes (Signed)
[facility] DEPT Provider Note   CSN: [id number] Arrival date & time: 08/06/21  1329     History Chief Complaint  Patient presents with   Nausea    Jacob Hicks is a 65 y.o. male.  Patient went to bed at around 1 in the morning feeling fine.  He awoke at about 830 and is soon as he sat up he had dizziness lightheadedness and when he tried to walk he was off balance.  Associated with some nausea but no vomiting.  No known weakness or numbness.  Patient's past medical history is significant for hypertension obesity and diabetes hyperlipidemia has a history of atrial fibrillation.  Patient is treated with Eliquis and on sotalol.  She is cardiac monitoring here is consistent with a sinus bradycardia.  Heart rate in the 40s.      Past Medical History:  Diagnosis Date   Atrial fibrillation ([place])    Diabetes mellitus without complication ([place])    Gout    Hyperlipemia    Hypertension    Obesity    Morbid    There are no problems to display for this patient.   Past Surgical History:  Procedure Laterality Date   REPLACEMENT TOTAL KNEE Bilateral        Family History  Problem Relation Age of Onset   Cancer Mother        Pancreatic   Heart attack Brother    Hypertension Other    Other Brother        kidney/dialysis    Social History   Tobacco Use   Smoking status: Never  Substance Use Topics   Alcohol use: No    Home Medications Prior to Admission medications   Medication Sig Start Date End Date Taking? Authorizing Provider  allopurinol (ZYLOPRIM) 300 MG tablet Take 300 mg by mouth daily.      [provider]  aspirin 325 MG EC tablet Take 325 mg by mouth daily.      [provider]  benazepril (LOTENSIN) 40 MG tablet Take 40 mg by mouth daily.      [provider]  gabapentin (NEURONTIN) 300 MG capsule Take 300 mg by mouth 2 (two) times daily.     [provider]  meloxicam (MOBIC) 15  MG tablet Take 15 mg by mouth daily.      [provider]  Multiple Vitamins-Minerals (MEGA MULTIVITAMIN FOR MEN) TABS Take 1 tablet by mouth daily.     [provider]  Omega-3 Fatty Acids (FISH OIL MAXIMUM STRENGTH) 1200 MG CAPS Take 1 capsule by mouth daily.     [provider]    Allergies    Patient has no known allergies.  Review of Systems   Review of Systems  Constitutional:  Negative for chills and fever.  HENT:  Negative for ear pain and sore throat.   Eyes:  Negative for pain and visual disturbance.  Respiratory:  Negative for cough and shortness of breath.   Cardiovascular:  Negative for chest pain and palpitations.  Gastrointestinal:  Positive for nausea. Negative for abdominal pain and vomiting.  Genitourinary:  Negative for dysuria and hematuria.  Musculoskeletal:  Negative for arthralgias and back pain.  Skin:  Negative for color change and rash.  Neurological:  Positive for dizziness and light-headedness. Negative for seizures and syncope.  All other systems reviewed and are negative.  Physical Exam Updated Vital Signs BP 122/64 (BP Location: Right Arm)    Pulse (!) 48  Temp 98.1 F (36.7 C)    Resp 18    Ht 1.854 m (6\' 1" )    Wt (!) 163.3 kg    SpO2 99%    BMI 47.50 kg/m   Physical Exam Vitals and nursing note reviewed.  Constitutional:      General: He is not in acute distress.    Appearance: Normal appearance. He is well-developed.  HENT:     Head: Normocephalic and atraumatic.  Eyes:     Conjunctiva/sclera: Conjunctivae normal.     Pupils: Pupils are equal, round, and reactive to light.  Cardiovascular:     Rate and Rhythm: Normal rate and regular rhythm.     Heart sounds: No murmur heard. Pulmonary:     Effort: Pulmonary effort is normal. No respiratory distress.     Breath sounds: Normal breath sounds.  Abdominal:     Palpations: Abdomen is soft.     Tenderness: There is no abdominal tenderness.  Musculoskeletal:         General: No swelling.     Cervical back: Normal range of motion and neck supple.  Skin:    General: Skin is warm and dry.     Capillary Refill: Capillary refill takes less than 2 seconds.  Neurological:     Mental Status: He is alert and oriented to person, place, and time.     Cranial Nerves: No cranial nerve deficit.     Sensory: No sensory deficit.     Motor: No weakness.     Coordination: Coordination normal.  Psychiatric:        Mood and Affect: Mood normal.    ED Results / Procedures / Treatments   Labs (all labs ordered are listed, but only abnormal results are displayed) Labs Reviewed  CBC WITH DIFFERENTIAL/PLATELET - Abnormal; Notable for the following components:      Result Value   Platelets 132 (*)    All other components within normal limits  COMPREHENSIVE METABOLIC PANEL - Abnormal; Notable for the following components:   Calcium 8.8 (*)    AST 13 (*)    All other components within normal limits    EKG EKG Interpretation  Date/Time:  Friday August 06 2021 16:39:58 EST Ventricular Rate:  51 PR Interval:  164 QRS Duration: 106 QT Interval:  458 QTC Calculation: 422 R Axis:   -5 Text Interpretation: Sinus rhythm Borderline T abnormalities, inferior leads No significant change since last tracing Confirmed by Fredia Sorrow [phone]) on 08/06/2021 4:46:35 PM  Radiology CT Head Wo Contrast  Result Date: 08/06/2021 CLINICAL DATA:  Nausea low energy level EXAM: CT HEAD WITHOUT CONTRAST TECHNIQUE: Contiguous axial images were obtained from the base of the skull through the vertex without intravenous contrast. COMPARISON:  CT brain report 12/03/1999 FINDINGS: Brain: No acute territorial infarction, hemorrhage or intracranial mass. The ventricles are nonenlarged. Vascular: No hyperdense vessels. Vertebral and carotid vascular calcification Skull: Normal. Negative for fracture or focal lesion. Sinuses/Orbits: No acute finding. Other: None IMPRESSION: Negative non  contrasted CT appearance of the brain Electronically Signed   By: Donavan Foil M.D.   On: 08/06/2021 17:01    Procedures Procedures   Medications Ordered in ED Medications  meclizine (ANTIVERT) tablet 25 mg (25 mg Oral Given 08/06/21 1707)    ED Course  I have reviewed the triage vital signs and the nursing notes.  Pertinent labs & imaging results that were available during my care of the patient were reviewed by me and considered in my  medical decision making (see chart for details).  Clinical Course as of 08/18/21 1531  Fri Aug 06, 2021  2249 Spoke with Dr. Derry Lory who recommends follow-up in the outpatient setting.  The findings on the MRI do not correlate with the patient's symptoms.  This is likely an incidental finding.  He recommends close follow-up in the outpatient setting and to continue his anticoagulation. [CF]    Clinical Course User Index [CF] Jolyn Lent   MDM Rules/Calculators/A&P                         Patient with significant risk for CVA.  Sudden onset of this this morning.  Did not walk patient here.  But he said at home he felt as if his coordination was off with walking.  No gross discoordination here on exam.  No upper extremity lower extremity weakness.  No visual changes no speech changes.  Head CT negative.  No anemia no leukocytosis electrolytes complete metabolic panel without any acute findings.  Renal function is normal glucose is 93.  Patient was given Ativan.  Symptoms are not completely resolved but there is been some improvement.  Clinically concerned I think we need to rule out CVA due to his risk factors.  We will make arrangements for him to be transferred to [facility] for MRI it will be an ED to ED transfer.  Discussed with Dr. Stevie Kern at [facility] ED and excepting patient for ED transfer after MRI.  Final Clinical Impression(s) / ED Diagnoses Final diagnoses:  Dizziness    Rx / DC Orders ED Discharge Orders     None         Vanetta Mulders, MD 08/18/21 1531

## 2021-08-06 NOTE — ED Notes (Signed)
Report given to [facility], pt verbally provides consent to transfer to [facility] ED via [facility].

## 2021-08-06 NOTE — Discharge Instructions (Signed)
Your work-up today did not reveal any signs of significant stroke to cause your symptoms.  I am unsure the exact cause of your symptoms at this time.  I would like for you to follow-up with neurology.  Please return to the emergency department if you experience worsening symptoms, trouble walking, trouble talking, weakness to your upper or lower extremities, or any other concerns you may have.

## 2021-08-06 NOTE — ED Notes (Signed)
Patient transported to CT 

## 2021-08-06 NOTE — ED Notes (Signed)
Pt transferred from [facility], here for MRI. Dizziness, nausea, no vomiting.

## 2021-08-06 NOTE — ED Notes (Signed)
Called [facility] to request transport to [facility] ED, Dx:  Dizziness - advised a truck will be sent as soon as available

## 2021-08-06 NOTE — ED Triage Notes (Signed)
Pt reports feeling generally unwell starting this morning. Pt states he's had low energy levels and nausea. Pt denies fever

## 2021-08-06 NOTE — Progress Notes (Signed)
Briefly Mr. Gaven Eugene is a 65 year old male with a history of paroxysmal atrial fibrillation on Eliquis and aspirin who woke up this morning feeling unwell with low energy, dizzy and lightheaded.  He initially presented to [facility] emergency department and transferred to [facility].  MRI Brain here with a small medial right temporal diffusion restriction without clear ADC correlate.  Likely this is a small subacute ischemia.  This appears to be a small white matter stroke. He is on maximal medical therapy from a stroke standpoint I think this can be safely worked up outpatient.  Follow up with stroke clinic outpatient for further workup. Okay to discharge.  Erick Blinks [facility] Pager Number [phone]

## 2021-08-06 NOTE — ED Notes (Signed)
Report called to [facility] ED to [facility], Consulting civil engineer. [facility] called for transport at this time.

## 2021-10-25 ENCOUNTER — Encounter: Payer: Self-pay | Admitting: Neurology

## 2021-10-25 ENCOUNTER — Ambulatory Visit: Payer: Medicare HMO | Admitting: Neurology

## 2021-10-25 VITALS — BP 123/73 | HR 57 | Ht 73.0 in | Wt 385.0 lb

## 2021-10-25 DIAGNOSIS — I639 Cerebral infarction, unspecified: Secondary | ICD-10-CM

## 2021-10-25 NOTE — Progress Notes (Signed)
? ?[facility] ? ?PATIENT: Jacob Hicks ?DOB: [birth year] ? ?REQUESTING CLINICIAN: Sonia Side., FNP ?HISTORY FROM: Patient  ?REASON FOR VISIT: Abnormal MRI  ? ? ?HISTORICAL ? ?CHIEF COMPLAINT:  ?Chief Complaint  ?Patient presents with  ? New Patient (Initial Visit)  ?  Rm 13, alone  ?NP/Paper/[facility]/Fred Tamala Julian NP [id number], abnormal MRI ?Pt reports no new sx  ? ? ?HISTORY OF PRESENT ILLNESS:  ?This is a 66 year old gentleman with past medical history of diabetes, hypertension, hyperlipidemia who is presenting for an abnormal MRI.  Patient stated back in December he was experiencing dizziness, had MRI brain which showed a punctate focus of hyperintensity on DWI imaging in the medial right temporal lobe without clear ADC correlate, this may be a focus of subacute stroke.  ?Patient was seen at that time by neurology who felt that the incidental finding is not the reason for his symptom of vertigo/dizziness.  Since then he has been doing well, denies any additional symptoms of dizziness and vertigo, stated that symptoms resolved.  He is back to his normal self, currently does not have any complaint other than wanted to discuss the abnormal MRI.  Denies any focal weakness, no numbness, denies any change in speech, no headaches no change in vision. ? ? ?OTHER MEDICAL CONDITIONS: Obesity, hypertension, Hyperlipidemia, Diabetes, Obesity.  ? ? ?REVIEW OF SYSTEMS: Full 14 system review of systems performed and negative with exception of: as noted in the HPI  ? ?ALLERGIES: ?No Known Allergies ? ?HOME MEDICATIONS: ?Outpatient Medications Prior to Visit  ?Medication Sig Dispense Refill  ? allopurinol (ZYLOPRIM) 300 MG tablet Take 300 mg by mouth daily.      ? aspirin 325 MG EC tablet Take 325 mg by mouth daily.      ? benazepril (LOTENSIN) 40 MG tablet Take 40 mg by mouth daily.      ? gabapentin (NEURONTIN) 300 MG capsule Take 300 mg by mouth 2 (two) times daily.     ? meloxicam (MOBIC)  15 MG tablet Take 15 mg by mouth daily.      ? Multiple Vitamins-Minerals (MEGA MULTIVITAMIN FOR MEN) TABS Take 1 tablet by mouth daily.     ? Omega-3 Fatty Acids (FISH OIL MAXIMUM STRENGTH) 1200 MG CAPS Take 1 capsule by mouth daily.     ? ?No facility-administered medications prior to visit.  ? ? ?PAST MEDICAL HISTORY: ?Past Medical History:  ?Diagnosis Date  ? Atrial fibrillation ([place])   ? Diabetes mellitus without complication ([place])   ? Gout   ? Hyperlipemia   ? Hypertension   ? Obesity   ? Morbid  ? ? ?PAST SURGICAL HISTORY: ?Past Surgical History:  ?Procedure Laterality Date  ? REPLACEMENT TOTAL KNEE Bilateral   ? ? ?FAMILY HISTORY: ?Family History  ?Problem Relation Age of Onset  ? Cancer Mother   ?     Pancreatic  ? Heart attack Brother   ? Hypertension Other   ? Other Brother   ?     kidney/dialysis  ? ? ?SOCIAL HISTORY: ?Social History  ? ?Socioeconomic History  ? Marital status: Divorced  ?  Spouse name: Not on file  ? Number of children: Not on file  ? Years of education: Not on file  ? Highest education level: Not on file  ?Occupational History  ? Occupation: Employed F/T Wellsite geologist  ?Tobacco Use  ? Smoking status: Never  ? Smokeless tobacco: Not on file  ?Substance and Sexual  Activity  ? Alcohol use: No  ? Drug use: Not on file  ? Sexual activity: Not on file  ?Other Topics Concern  ? Not on file  ?Social History Narrative  ? Not on file  ? ?Social Determinants of Health  ? ?Financial Resource Strain: Not on file  ?Food Insecurity: Not on file  ?Transportation Needs: Not on file  ?Physical Activity: Not on file  ?Stress: Not on file  ?Social Connections: Not on file  ?Intimate Partner Violence: Not on file  ? ? ? ?PHYSICAL EXAM ?GENERAL EXAM/CONSTITUTIONAL: ?Vitals:  ?Vitals:  ? 10/25/21 0852  ?BP: 123/73  ?Pulse: (!) 57  ?Weight: (!) 385 lb (174.6 kg)  ?Height: 6\' 1"  (1.854 m)  ? ?Body mass index is 50.79 kg/m?. ?Wt Readings from Last 3 Encounters:  ?10/25/21 (!) 385 lb (174.6 kg)   ?08/06/21 (!) 360 lb (163.3 kg)  ? ?Patient is in no distress; well developed, nourished and groomed; neck is supple ? ?EYES: ?Pupils round and reactive to light, Visual fields full to confrontation, Extraocular movements intacts,  ? ?MUSCULOSKELETAL: ?Gait, strength, tone, movements noted in Neurologic exam below ? ?NEUROLOGIC: ?MENTAL STATUS:  ?No flowsheet data found. ?awake, alert, oriented to person, place and time ?recent and remote memory intact ?normal attention and concentration ?language fluent, comprehension intact, naming intact ?fund of knowledge appropriate ? ?CRANIAL NERVE:  ?2nd, 3rd, 4th, 6th - pupils equal and reactive to light, visual fields full to confrontation, extraocular muscles intact, no nystagmus ?5th - facial sensation symmetric ?7th - facial strength symmetric ?8th - hearing intact ?9th - palate elevates symmetrically, uvula midline ?11th - shoulder shrug symmetric ?12th - tongue protrusion midline ? ?MOTOR:  ?normal bulk and tone, full strength in the BUE, BLE ? ?SENSORY:  ?normal and symmetric to light touch, pinprick, temperature, vibration ? ?COORDINATION:  ?finger-nose-finger, fine finger movements normal ? ?REFLEXES:  ?deep tendon reflexes present and symmetric ? ?GAIT/STATION:  ?normal ? ? ?DIAGNOSTIC DATA (LABS, IMAGING, TESTING) ?- I reviewed patient records, labs, notes, testing and imaging myself where available. ? ?Lab Results  ?Component Value Date  ? WBC 6.4 08/06/2021  ? HGB 13.8 08/06/2021  ? HCT 40.8 08/06/2021  ? MCV 91.5 08/06/2021  ? PLT 132 (L) 08/06/2021  ? ?   ?Component Value Date/Time  ? NA 139 08/06/2021 1716  ? K 3.9 08/06/2021 1716  ? CL 105 08/06/2021 1716  ? CO2 27 08/06/2021 1716  ? GLUCOSE 93 08/06/2021 1716  ? BUN 12 08/06/2021 1716  ? CREATININE 0.79 08/06/2021 1716  ? CALCIUM 8.8 (L) 08/06/2021 1716  ? PROT 7.1 08/06/2021 1716  ? ALBUMIN 3.5 08/06/2021 1716  ? AST 13 (L) 08/06/2021 1716  ? ALT 10 08/06/2021 1716  ? ALKPHOS 66 08/06/2021 1716  ? BILITOT  0.4 08/06/2021 1716  ? GFRNONAA >60 08/06/2021 1716  ? ?No results found for: CHOL, HDL, LDLCALC, LDLDIRECT, TRIG, CHOLHDL ?No results found for: HGBA1C ?No results found for: VITAMINB12 ?No results found for: TSH ? ?MRI Brain 08/06/21 ?1. Punctate focus of hyperintensity on diffusion-weighted imaging in the medial right temporal lobe without clear ADC correlate. This may be a small focus of subacute ischemia. ?2. Otherwise normal brain MRI. ? ? ? ?ASSESSMENT AND PLAN ? ?66 y.o. year old male with vascular risk factor including hypertension, hyperlipidemia, diabetes mellitus and obesity who is presenting after having abnormal MRI for dizziness. MRI showing a punctate focus in the right medial temporal lobe which could represent a subacute stroke.  Currently patient is on aspirin 81 mg daily, advised him to continue same medication, I will obtain stroke labs including lipid panel and hemoglobin A1c.  I will contact him to go over the result.  Advised him to follow-up in 1 year. ? ? ?1. Cerebrovascular accident (CVA), unspecified mechanism ([place])   ?2. Morbid obesity ([place])   ? ? ? ?Patient Instructions  ?Continue current medication ?We will obtain stroke labs including hemoglobin A1c and lipid panel ?Follow-up to primary care doctor ?Follow-up in 1 year ? ?Orders Placed This Encounter  ?Procedures  ? Lipid Panel  ? Hemoglobin A1c  ? ? ?No orders of the defined types were placed in this encounter. ? ? ?Return in about 1 year (around 10/26/2022). ? ?I have spent a total of 47 minutes dedicated to this patient today, preparing to see patient, performing a medically appropriate examination and evaluation, ordering tests and/or medications and procedures, and counseling and educating the patient/family/caregiver; independently interpreting result and communicating results to the family/patient/caregiver; and documenting clinical information in the electronic medical record. ? ? ?Alric Ran, MD 10/25/2021, 4:13  PM ? ?[facility] ?[address], [address] ?[place], [place] [address] ?[phone]  ?

## 2021-10-25 NOTE — Patient Instructions (Addendum)
Continue current medication ?We will obtain stroke labs including hemoglobin A1c and lipid panel ?Follow-up to primary care doctor ?Follow-up in 1 year ?

## 2021-10-26 LAB — LIPID PANEL
Chol/HDL Ratio: 2.5 ratio (ref 0.0–5.0)
Cholesterol, Total: 106 mg/dL (ref 100–199)
HDL: 42 mg/dL (ref >39)
LDL Chol Calc (NIH): 53 mg/dL (ref 0–99)
Triglycerides: 42 mg/dL (ref 0–149)
VLDL Cholesterol Cal: 11 mg/dL (ref 5–40)

## 2021-10-26 LAB — HEMOGLOBIN A1C
Est. average glucose Bld gHb Est-mCnc: 114 mg/dL
Hgb A1c MFr Bld: 5.6 % (ref 4.8–5.6)

## 2021-12-28 ENCOUNTER — Encounter: Payer: Self-pay | Admitting: Podiatry

## 2021-12-28 ENCOUNTER — Ambulatory Visit: Payer: Medicare Other | Admitting: Podiatry

## 2021-12-28 DIAGNOSIS — L602 Onychogryphosis: Secondary | ICD-10-CM | POA: Diagnosis not present

## 2021-12-28 MED ORDER — NEOMYCIN-POLYMYXIN-HC 1 % OT SOLN: OTIC | 0 refills | Status: DC
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
## 2021-12-28 NOTE — Patient Instructions (Signed)

## 2022-01-02 ENCOUNTER — Encounter: Payer: Self-pay | Admitting: Podiatry

## 2022-02-28 ENCOUNTER — Other Ambulatory Visit: Payer: Self-pay | Admitting: Orthopaedic Surgery

## 2022-02-28 DIAGNOSIS — M545 Low back pain, unspecified: Secondary | ICD-10-CM

## 2022-03-19 ENCOUNTER — Ambulatory Visit
Admission: RE | Admit: 2022-03-19 | Discharge: 2022-03-19 | Disposition: A | Discharge status: Home / Self Care | Payer: Medicare Other | Source: Ambulatory Visit | Attending: Orthopaedic Surgery | Admitting: Orthopaedic Surgery

## 2022-03-19 DIAGNOSIS — M545 Low back pain, unspecified: Secondary | ICD-10-CM

## 2022-07-12 ENCOUNTER — Ambulatory Visit (INDEPENDENT_AMBULATORY_CARE_PROVIDER_SITE_OTHER): Payer: Medicare Other

## 2022-07-12 ENCOUNTER — Ambulatory Visit
Admission: RE | Admit: 2022-07-12 | Discharge: 2022-07-12 | Disposition: A | Discharge status: Home / Self Care | Payer: Medicare Other | Source: Ambulatory Visit | Attending: Emergency Medicine | Admitting: Emergency Medicine

## 2022-07-12 VITALS — BP 115/79 | HR 68 | Temp 98.0°F | Resp 16

## 2022-07-12 DIAGNOSIS — M542 Cervicalgia: Secondary | ICD-10-CM | POA: Diagnosis not present

## 2022-07-12 DIAGNOSIS — M4812 Ankylosing hyperostosis [Forestier], cervical region: Secondary | ICD-10-CM

## 2022-07-12 MED ORDER — ACETAMINOPHEN 500 MG PO TABS: 1000.0000 mg | ORAL_TABLET | Freq: Three times a day (TID) | ORAL | 0 refills | Status: AC

## 2022-07-12 MED ORDER — MELOXICAM 15 MG PO TABS: 15.0000 mg | ORAL_TABLET | Freq: Every day | ORAL | 0 refills | Status: DC

## 2022-07-12 NOTE — Discharge Instructions (Addendum)
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  There are no known causes for this disease but this is definitely the source of your pain at this time.  Please follow-up with your primary care provider regarding this condition to see if they recommend referral to orthopedics for pain intervention.  In the meantime, I recommend that you take Tylenol 1000 mg every 8 hours.  Thank you for visiting urgent care.

## 2022-07-12 NOTE — ED Provider Notes (Signed)
[facility]    CSN: [id number] Arrival date & time: 07/12/22  1754    HISTORY   Chief Complaint  Patient presents with   Neck Pain   HPI Jacob Hicks is a pleasant, 65 y.o. male who presents to urgent care today. Pt states stiff neck for the past week states now the left side of his face is hurting. Pt also has a rash to his left hand. States he has been using ice,heat and pain medications at home with no relief.  The history is provided by the patient.   Past Medical History:  Diagnosis Date   Atrial fibrillation (HCC)    Diabetes mellitus without complication (HCC)    Gout    Hyperlipemia    Hypertension    Obesity    Morbid   Patient Active Problem List   Diagnosis Date Noted   Back pain 05/07/2018   Family history of coronary arteriosclerosis 10/02/2017   Hypertension 10/02/2017   Paroxysmal atrial fibrillation (HCC) 10/02/2017   Lumbar spondylosis 08/10/2017   Pain syndrome, chronic 08/10/2017   Spinal stenosis of lumbar region without neurogenic claudication 08/10/2017   Past Surgical History:  Procedure Laterality Date   REPLACEMENT TOTAL KNEE Bilateral     Home Medications    Prior to Admission medications   Medication Sig Start Date End Date Taking? Authorizing Provider  allopurinol (ZYLOPRIM) 300 MG tablet Take 300 mg by mouth daily.      [provider]  aspirin 325 MG EC tablet Take 325 mg by mouth daily.      [provider]  aspirin 81 MG chewable tablet Chew by mouth.    [provider]  atorvastatin (LIPITOR) 40 MG tablet Take 40 mg by mouth at bedtime. 08/10/21   [provider]  benazepril (LOTENSIN) 40 MG tablet Take 40 mg by mouth daily.      [provider]  Cholecalciferol 125 MCG (5000 UT) TABS Take by mouth.    [provider]  ELIQUIS 5 MG TABS tablet Take 5 mg by mouth 2 (two) times daily. 12/07/21   [provider]  finasteride (PROSCAR) 5 MG tablet Take by  mouth. 05/07/19   [provider]  gabapentin (NEURONTIN) 300 MG capsule Take 300 mg by mouth 2 (two) times daily.     [provider]  liraglutide (VICTOZA) 18 MG/3ML SOPN Inject into the skin. 03/21/18   [provider]  meloxicam (MOBIC) 15 MG tablet Take 15 mg by mouth daily.      [provider]  metFORMIN (GLUCOPHAGE) 500 MG tablet Take 500 mg by mouth every morning. 11/25/21   [provider]  methocarbamol (ROBAXIN) 500 MG tablet Take 500 mg by mouth 2 (two) times daily. 10/17/21   [provider]  metroNIDAZOLE (FLAGYL) 500 MG tablet Take 4 tabs Now 05/01/18   [provider]  Multiple Vitamins-Minerals (MEGA MULTIVITAMIN FOR MEN) TABS Take 1 tablet by mouth daily.     [provider]  niacin 500 MG tablet Take by mouth.    [provider]  Olopatadine HCl 0.7 % SOLN Place 1 drop into the left eye every morning.    [provider]  Omega-3 Fatty Acids (FISH OIL MAXIMUM STRENGTH) 1200 MG CAPS Take 1 capsule by mouth daily.     [provider]  pregabalin (LYRICA) 150 MG capsule Take 150 mg by mouth 2 (two) times daily. 12/16/21   [provider]  sotalol (BETAPACE)  80 MG tablet Take 80 mg by mouth 2 (two) times daily. 12/09/21   [provider]  traMADol Janean Sark) 50 MG tablet  10/24/17   [provider]  vitamin B-12 (CYANOCOBALAMIN) 250 MCG tablet Take by mouth.    [provider]    Family History Family History  Problem Relation Age of Onset   Cancer Mother        Pancreatic   Heart attack Brother    Hypertension Other    Other Brother        kidney/dialysis   Social History Social History   Tobacco Use   Smoking status: Never  Substance Use Topics   Alcohol use: No   Allergies   Patient has no known allergies.  Review of Systems Review of Systems Pertinent findings revealed after performing a 14 point review of systems has been noted in the  history of present illness.  Physical Exam Triage Vital Signs ED Triage Vitals  Enc Vitals Group     BP 06/11/21 0827 (!) 147/82     Pulse Rate 06/11/21 0827 72     Resp 06/11/21 0827 18     Temp 06/11/21 0827 98.3 F (36.8 C)     Temp Source 06/11/21 0827 Oral     SpO2 06/11/21 0827 98 %     Weight --      Height --      Head Circumference --      Peak Flow --      Pain Score 06/11/21 0826 5     Pain Loc --      Pain Edu? --      Excl. in GC? --   No data found.  Updated Vital Signs BP 115/79 (BP Location: Right Arm)   Pulse 68   Temp 98 F (36.7 C) (Oral)   Resp 16   SpO2 95%   Physical Exam  Visual Acuity Right Eye Distance:   Left Eye Distance:   Bilateral Distance:    Right Eye Near:   Left Eye Near:    Bilateral Near:     [facility] Couse / Diagnostics / Procedures:     Radiology DG Cervical Spine Complete  Result Date: 07/12/2022 CLINICAL DATA:  Pain EXAM: CERVICAL SPINE - COMPLETE 4+ VIEW COMPARISON:  07/10/2002 report FINDINGS: Straightening of the cervical spine. Mild diffuse disc space narrowing throughout the cervical spine. Bulky anterior osteophytosis C3 through C7. Normal prevertebral soft tissue thickness. Facet degenerative changes at multiple levels. Limited assessment of the foramen due to positioning. IMPRESSION: Straightening of the cervical spine with diffuse degenerative changes. No acute osseous abnormality. 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   On: 07/12/2022 19:11    Procedures Procedures (including critical care time) EKG  Pending results:  Labs Reviewed - No data to display  Medications Ordered in UC: Medications - No data to display  UC Diagnoses / Final Clinical Impressions(s)   I have reviewed the triage vital signs and the nursing notes.  Pertinent labs & imaging results that were available during my care of the patient were reviewed by me and considered in my  medical decision making (see chart for details).    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 Provider   meloxicam (MOBIC) 15 MG tablet  (Status: Discontinued) Take 1 tablet (15 mg total) by  mouth daily. 30 tablet Theadora Rama Scales, PA-C   acetaminophen (TYLENOL) 500 MG tablet Take 2 tablets (1,000 mg total) by mouth every 8 (eight) hours. 180 tablet Theadora Rama Scales, PA-C      PDMP not reviewed this encounter.  Pending results:  Labs Reviewed - No data to display  Discharge Instructions:   Discharge Instructions      You have a chronic condition called diffuse idiopathic skeletal hyperostosis of your cervical spine.  There are no known causes for this disease but this is definitely the source of your pain at this time.  Please follow-up with your primary care provider regarding this condition to see if they recommend referral to orthopedics for pain intervention.  In the meantime, I recommend that you take Tylenol 1000 mg every 8 hours.  Thank you for visiting urgent care.        Disposition Upon Discharge:  Condition: stable for discharge home  Patient presented with an acute illness with associated systemic symptoms and significant discomfort requiring urgent management. In my opinion, this is a condition that a prudent lay person (someone who possesses an average knowledge of health and medicine) may potentially expect to result in complications if not addressed urgently such as respiratory distress, impairment of bodily function or dysfunction of bodily organs.   Routine symptom specific, illness specific and/or disease specific instructions were discussed with the patient and/or caregiver at length.   As such, the patient has been evaluated and assessed, work-up was performed and treatment was provided in alignment with urgent care protocols and evidence based  medicine.  Patient/parent/caregiver has been advised that the patient may require follow up for further testing and treatment if the symptoms continue in spite of treatment, as clinically indicated and appropriate.  Patient/parent/caregiver has been advised to return to the [facility] or PCP if no better; to PCP or the Emergency Department if new signs and symptoms develop, or if the current signs or symptoms continue to change or worsen for further workup, evaluation and treatment as clinically indicated and appropriate  The patient will follow up with their current PCP if and as advised. If the patient does not currently have a PCP we will assist them in obtaining one.   The patient may need specialty follow up if the symptoms continue, in spite of conservative treatment and management, for further workup, evaluation, consultation and treatment as clinically indicated and appropriate.   Patient/parent/caregiver verbalized understanding and agreement of plan as discussed.  All questions were addressed during visit.  Please see discharge instructions below for further details of plan.  This office note has been dictated using Teaching laboratory technician.  Unfortunately, this method of dictation can sometimes lead to typographical or grammatical errors.  I apologize for your inconvenience in advance if this occurs.  Please do not hesitate to reach out to me if clarification is needed.

## 2022-07-12 NOTE — ED Triage Notes (Signed)
Pt states stiff neck for the past week states now the left side of his face is hurting. Pt also has a rash to his left hand. States he has been using ice,heat and pain medications at home with no relief.

## 2022-10-27 ENCOUNTER — Encounter: Payer: Self-pay | Admitting: Neurology

## 2022-10-27 ENCOUNTER — Ambulatory Visit: Payer: Medicare PPO | Admitting: Neurology

## 2022-10-27 VITALS — BP 100/67 | HR 60 | Ht 73.0 in | Wt 376.0 lb

## 2022-10-27 DIAGNOSIS — I639 Cerebral infarction, unspecified: Secondary | ICD-10-CM | POA: Diagnosis not present

## 2022-10-27 NOTE — Progress Notes (Signed)
[facility]  PATIENT: Jacob Hicks DOB: 06/13/1956  REQUESTING CLINICIAN: Sonia Side., FNP HISTORY FROM: Patient  REASON FOR VISIT: Abnormal MRI    HISTORICAL  CHIEF COMPLAINT:  Chief Complaint  Patient presents with   Follow-up    Rm 12, with  States he is doing well  Pt is waiting for sleep study appt    INTERVAL HISTORY 10/27/2022:  Patient presents today for follow-up, he is accompanied by wife.  Last visit was a year ago.  Since then he has been doing very well.  Denies any new symptoms.  He is off the aspirin, he is on Eliquis.  Again no new concerns, actually he thought he was coming in for a sleep evaluation. We have not received any referral to sleep evaluation.    HISTORY OF PRESENT ILLNESS:  This is a 67 year old gentleman with past medical history of diabetes, hypertension, hyperlipidemia who is presenting for an abnormal MRI.  Patient stated back in December he was experiencing dizziness, had MRI brain which showed a punctate focus of hyperintensity on DWI imaging in the medial right temporal lobe without clear ADC correlate, this may be a focus of subacute stroke.  Patient was seen at that time by neurology who felt that the incidental finding is not the reason for his symptom of vertigo/dizziness.  Since then he has been doing well, denies any additional symptoms of dizziness and vertigo, stated that symptoms resolved.  He is back to his normal self, currently does not have any complaint other than wanted to discuss the abnormal MRI.  Denies any focal weakness, no numbness, denies any change in speech, no headaches no change in vision.   OTHER MEDICAL CONDITIONS: Obesity, hypertension, Hyperlipidemia, Diabetes, Obesity.    REVIEW OF SYSTEMS: Full 14 system review of systems performed and negative with exception of: as noted in the HPI   ALLERGIES: No Known Allergies  HOME MEDICATIONS: Outpatient Medications Prior to Visit  Medication  Sig Dispense Refill   allopurinol (ZYLOPRIM) 300 MG tablet Take 300 mg by mouth daily.       aspirin 325 MG EC tablet Take 325 mg by mouth daily.       atorvastatin (LIPITOR) 40 MG tablet Take 40 mg by mouth at bedtime.     benazepril (LOTENSIN) 40 MG tablet Take 40 mg by mouth daily.       Cholecalciferol 125 MCG (5000 UT) TABS Take by mouth.     ELIQUIS 5 MG TABS tablet Take 5 mg by mouth 2 (two) times daily.     finasteride (PROSCAR) 5 MG tablet Take by mouth.     gabapentin (NEURONTIN) 300 MG capsule Take 300 mg by mouth 2 (two) times daily.      methocarbamol (ROBAXIN) 500 MG tablet Take 500 mg by mouth 2 (two) times daily.     Multiple Vitamins-Minerals (MEGA MULTIVITAMIN FOR MEN) TABS Take 1 tablet by mouth daily.      niacin 500 MG tablet Take by mouth.     Olopatadine HCl 0.7 % SOLN Place 1 drop into the left eye every morning.     Omega-3 Fatty Acids (FISH OIL MAXIMUM STRENGTH) 1200 MG CAPS Take 1 capsule by mouth daily.      sotalol (BETAPACE) 80 MG tablet Take 80 mg by mouth 2 (two) times daily.     traMADol (ULTRAM) 50 MG tablet      vitamin B-12 (CYANOCOBALAMIN) 250 MCG tablet Take by mouth.  aspirin 81 MG chewable tablet Chew by mouth.     liraglutide (VICTOZA) 18 MG/3ML SOPN Inject into the skin.     metFORMIN (GLUCOPHAGE) 500 MG tablet Take 500 mg by mouth every morning.     metroNIDAZOLE (FLAGYL) 500 MG tablet Take 4 tabs Now     pregabalin (LYRICA) 150 MG capsule Take 150 mg by mouth 2 (two) times daily.     No facility-administered medications prior to visit.    PAST MEDICAL HISTORY: Past Medical History:  Diagnosis Date   Atrial fibrillation ([place])    Diabetes mellitus without complication ([place])    Gout    Hyperlipemia    Hypertension    Obesity    Morbid    PAST SURGICAL HISTORY: Past Surgical History:  Procedure Laterality Date   REPLACEMENT TOTAL KNEE Bilateral     FAMILY HISTORY: Family History  Problem Relation Age of Onset   Cancer Mother         Pancreatic   Heart attack Brother    Hypertension Other    Other Brother        kidney/dialysis    SOCIAL HISTORY: Social History   Socioeconomic History   Marital status: Divorced    Spouse name: Not on file   Number of children: Not on file   Years of education: Not on file   Highest education level: Not on file  Occupational History   Occupation: Employed F/T Wellsite geologist  Tobacco Use   Smoking status: Never   Smokeless tobacco: Not on file  Substance and Sexual Activity   Alcohol use: No   Drug use: Not on file   Sexual activity: Not on file  Other Topics Concern   Not on file  Social History Narrative   Not on file   Social Determinants of Health   Financial Resource Strain: Not on file  Food Insecurity: Not on file  Transportation Needs: Not on file  Physical Activity: Not on file  Stress: Not on file  Social Connections: Not on file  Intimate Partner Violence: Not on file     PHYSICAL EXAM GENERAL EXAM/CONSTITUTIONAL: Vitals:  Vitals:   10/27/22 1142  BP: 100/67  Pulse: 60  Weight: (!) 376 lb (170.6 kg)  Height: '6\' 1"'$  (1.854 m)    Body mass index is 49.61 kg/m. Wt Readings from Last 3 Encounters:  10/27/22 (!) 376 lb (170.6 kg)  10/25/21 (!) 385 lb (174.6 kg)  08/06/21 (!) 360 lb (163.3 kg)   Patient is in no distress; well developed, nourished and groomed; neck is supple, Obese gentleman   EYES: Pupils round and reactive to light, Visual fields full to confrontation, Extraocular movements intacts,   MUSCULOSKELETAL: Gait, strength, tone, movements noted in Neurologic exam below  NEUROLOGIC: MENTAL STATUS:      No data to display         awake, alert, oriented to person, place and time recent and remote memory intact normal attention and concentration language fluent, comprehension intact, naming intact fund of knowledge appropriate  CRANIAL NERVE:  2nd, 3rd, 4th, 6th - pupils equal and reactive to light,  visual fields full to confrontation, extraocular muscles intact, no nystagmus 5th - facial sensation symmetric 7th - facial strength symmetric 8th - hearing intact 9th - palate elevates symmetrically, uvula midline 11th - shoulder shrug symmetric 12th - tongue protrusion midline  MOTOR:  normal bulk and tone, full strength in the BUE, BLE  SENSORY:  normal and symmetric to  light touch, pinprick, temperature, vibration  COORDINATION:  finger-nose-finger, fine finger movements normal  REFLEXES:  deep tendon reflexes present and symmetric  GAIT/STATION:  normal   DIAGNOSTIC DATA (LABS, IMAGING, TESTING) - I reviewed patient records, labs, notes, testing and imaging myself where available.  Lab Results  Component Value Date   WBC 6.4 08/06/2021   HGB 13.8 08/06/2021   HCT 40.8 08/06/2021   MCV 91.5 08/06/2021   PLT 132 (L) 08/06/2021      Component Value Date/Time   NA 139 08/06/2021 1716   K 3.9 08/06/2021 1716   CL 105 08/06/2021 1716   CO2 27 08/06/2021 1716   GLUCOSE 93 08/06/2021 1716   BUN 12 08/06/2021 1716   CREATININE 0.79 08/06/2021 1716   CALCIUM 8.8 (L) 08/06/2021 1716   PROT 7.1 08/06/2021 1716   ALBUMIN 3.5 08/06/2021 1716   AST 13 (L) 08/06/2021 1716   ALT 10 08/06/2021 1716   ALKPHOS 66 08/06/2021 1716   BILITOT 0.4 08/06/2021 1716   GFRNONAA >60 08/06/2021 1716   Lab Results  Component Value Date   CHOL 106 10/25/2021   HDL 42 10/25/2021   LDLCALC 53 10/25/2021   TRIG 42 10/25/2021   CHOLHDL 2.5 10/25/2021   Lab Results  Component Value Date   HGBA1C 5.6 10/25/2021   No results found for: "VITAMINB12" No results found for: "TSH"  MRI Brain 08/06/21 1. Punctate focus of hyperintensity on diffusion-weighted imaging in the medial right temporal lobe without clear ADC correlate. This may be a small focus of subacute ischemia. 2. Otherwise normal brain MRI.    ASSESSMENT AND PLAN  67 y.o. year old male with vascular risk factor  including hypertension, hyperlipidemia, diabetes mellitus and obesity who is presenting for follow up for an incidental right medial temporal stroke.  Overall he is doing well, his stroke lab completed, his LDL is 53 and his hemoglobin A1c 5.6.  He is on Lipitor.  He is also on Eliquis therefore aspirin discontinued.  Again denies any symptoms, no complaints and no other concerns.  Plan for patient is to continue following up with his PCP and return as needed, he is planning to move to [place].  He voiced understanding.    1. Cerebrovascular accident (CVA), unspecified mechanism ([place])   2. Morbid obesity [facility])      Patient Instructions  Follow up with PCP  Return as needed   No orders of the defined types were placed in this encounter.   No orders of the defined types were placed in this encounter.   Return if symptoms worsen or fail to improve.   Alric Ran, MD 10/27/2022, 12:22 PM  [facility] [address], [place], [place] [address] [phone]

## 2022-10-27 NOTE — Patient Instructions (Signed)
Follow up with PCP  Return as needed

## 2022-12-09 ENCOUNTER — Other Ambulatory Visit: Payer: Self-pay | Admitting: Family

## 2022-12-09 ENCOUNTER — Ambulatory Visit
Admission: RE | Admit: 2022-12-09 | Discharge: 2022-12-09 | Disposition: A | Discharge status: Home / Self Care | Payer: Medicare PPO | Source: Ambulatory Visit | Attending: Family | Admitting: Family

## 2022-12-09 DIAGNOSIS — M25551 Pain in right hip: Secondary | ICD-10-CM

## 2023-07-12 ENCOUNTER — Ambulatory Visit
Admission: RE | Admit: 2023-07-12 | Discharge: 2023-07-12 | Disposition: A | Discharge status: Home / Self Care | Payer: Medicare HMO | Source: Ambulatory Visit

## 2023-07-12 ENCOUNTER — Telehealth: Payer: Self-pay | Admitting: Emergency Medicine

## 2023-07-12 ENCOUNTER — Ambulatory Visit: Payer: Medicare HMO

## 2023-07-12 VITALS — BP 156/100 | HR 73 | Temp 98.1°F | Resp 16

## 2023-07-12 DIAGNOSIS — M7989 Other specified soft tissue disorders: Secondary | ICD-10-CM

## 2023-07-12 DIAGNOSIS — R0602 Shortness of breath: Secondary | ICD-10-CM

## 2023-07-12 LAB — POCT URINALYSIS DIP (MANUAL ENTRY)
Bilirubin, UA: NEGATIVE
Glucose, UA: NEGATIVE mg/dL
Ketones, POC UA: NEGATIVE mg/dL
Leukocytes, UA: NEGATIVE
Nitrite, UA: NEGATIVE
Protein Ur, POC: 100 mg/dL — AB
Spec Grav, UA: >=1.03 — AB (ref 1.010–1.025)
Urobilinogen, UA: 2 U/dL — AB
pH, UA: 7 (ref 5.0–8.0)

## 2023-07-12 MED ORDER — ALBUTEROL SULFATE HFA 108 (90 BASE) MCG/ACT IN AERS: 2.0000 | INHALATION_SPRAY | RESPIRATORY_TRACT | 0 refills | Status: AC | PRN

## 2023-07-12 MED ORDER — FUROSEMIDE 20 MG PO TABS: 20.0000 mg | ORAL_TABLET | Freq: Every day | ORAL | 0 refills | Status: AC

## 2023-07-12 NOTE — ED Triage Notes (Signed)
Pt presents with right foot swelling and pain x 3 days. He also c/o SOB for 3 days.

## 2023-07-12 NOTE — Telephone Encounter (Signed)
error 

## 2023-07-12 NOTE — Telephone Encounter (Signed)
Reported x-ray results and urine results to patient via telephone, 2 patient identifiers used, chest x-ray is negative, unknown etiology for shortness of breath and leg swelling at this time however stable for outpatient management, advised to monitor, furosemide 20 mg daily for 5 days as well as inhaler prescribed, discussed the fact of frequency and urgency, recommended elevation, compression stockings or wraps, advise follow-up for any worsening breathing

## 2023-07-12 NOTE — ED Provider Notes (Signed)
Jacob Hicks    CSN: [id number] Arrival date & time: 07/12/23  1326      History   Chief Complaint Chief Complaint  Patient presents with   Foot Pain    Entered by patient   Foot Swelling   Shortness of Breath    HPI Jacob Hicks is a 67 y.o. male.   Patient presents for eval of bilateral leg swelling, right foot worse than left beginning 3 days ago, shortness of breath experience with exertion and wheezing beginning 1 day ago.  Has not occurred in the past.  Recent travel from [place] to [place] for the holiday.  Denies calf pain, erythema, chest pain or tightness, fever or URI symptoms.  Did endorse that on the drive up that they had to stop approximately every 45 minutes to an hour for urination.  Denies dysuria, hematuria, abdominal or flank pain.  Has not attempted treatment of symptoms.  History of atrial fibrillation, diabetes, morbid obesity, hypertension.  Has prior respiratory history, never smoker.   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 Authorizing Provider  atorvastatin (LIPITOR) 40 MG tablet Take 40 mg by mouth at bedtime. 08/10/21  Yes [provider]  ELIQUIS 5 MG TABS tablet Take 5 mg by mouth 2 (two) times daily. 12/07/21  Yes [provider]  finasteride (PROSCAR) 5 MG tablet Take by mouth. 05/07/19  Yes [provider]  ibuprofen (ADVIL) 800 MG tablet Take 800 mg by mouth 3 (three) times daily. 05/16/23  Yes [provider]  pregabalin (LYRICA) 150 MG capsule Take 150 mg by mouth 2 (two) times daily.   Yes [provider]  sotalol (BETAPACE) 80 MG tablet Take 80 mg by mouth 2 (two) times daily. 12/09/21  Yes [provider]  tiZANidine (ZANAFLEX) 4 MG tablet Take 4 mg by mouth every 8 (eight) hours as needed. 05/16/23  Yes [provider]  allopurinol (ZYLOPRIM) 300 MG tablet Take 300 mg by mouth daily.      [provider]  aspirin 325 MG EC tablet Take 325 mg by mouth daily.      [provider]  benazepril (LOTENSIN) 40 MG tablet Take 40 mg by mouth daily.      [provider]  Cholecalciferol 125 MCG (5000 UT) TABS Take by mouth.    [provider]  gabapentin (NEURONTIN) 300 MG capsule Take 300 mg by mouth 2 (two) times daily.     [provider]  methocarbamol (ROBAXIN) 500 MG tablet Take 500 mg by mouth 2 (two) times daily. 10/17/21   [provider]  Multiple Vitamins-Minerals (MEGA MULTIVITAMIN FOR MEN) TABS Take 1 tablet by mouth daily.     [provider]  niacin 500 MG tablet Take by mouth.    [provider]  Olopatadine HCl 0.7 % SOLN Place 1 drop into the left eye every morning.    [provider]  Omega-3 Fatty Acids (FISH OIL MAXIMUM STRENGTH) 1200 MG CAPS Take 1 capsule by mouth daily.     [provider]  traMADol Janean Sark) 50 MG tablet  10/24/17   [provider]  vitamin B-12 (CYANOCOBALAMIN) 250 MCG tablet Take by mouth.    [provider]    Family History Family History  Problem Relation Age of Onset   Cancer Mother        Pancreatic   Heart attack Brother    Hypertension Other    Other Brother        kidney/dialysis    Social  History Social History   Tobacco Use   Smoking status: Never   Smokeless tobacco: Never  Vaping Use   Vaping status: Never Used  Substance Use Topics   Alcohol use: No   Drug use: Never     Allergies   Patient has no known allergies.   Review of Systems Review of Systems  Respiratory:  Positive for shortness of breath.      Physical Exam Triage Vital Signs ED Triage Vitals  Encounter Vitals Group     BP 07/12/23 1358 (!) 156/100     Systolic BP Percentile --      Diastolic BP Percentile --      Pulse Rate 07/12/23 1358 73     Resp 07/12/23 1358 16     Temp 07/12/23 1358 98.1 F (36.7 C)     Temp Source 07/12/23 1358 Oral     SpO2 07/12/23 1358 96 %     Weight --      Height --      Head Circumference --      Peak Flow --      Pain Score 07/12/23 1356 0     Pain Loc --      Pain Education --      Exclude from Growth Chart --    No data found.  Updated Vital Signs BP (!) 156/100 (BP Location: Left Arm)   Pulse 73   Temp 98.1 F (36.7 C) (Oral)   Resp 16   SpO2 96%   Visual Acuity Right Eye Distance:   Left Eye Distance:   Bilateral Distance:    Right Eye Near:   Left Eye Near:    Bilateral Near:     Physical Exam Constitutional:      Appearance: Normal appearance.  HENT:     Head: Normocephalic.  Eyes:     Extraocular Movements: Extraocular movements intact.  Cardiovascular:     Rate and Rhythm: Normal rate and regular rhythm.     Pulses: Normal pulses.     Heart sounds: Normal heart sounds.     Comments: +2 edema to the right foot, +1 edema to the left foot Pulmonary:     Effort: Pulmonary effort is normal.     Breath sounds: Normal breath sounds.  Musculoskeletal:     Right lower leg: 3+ Edema present.     Left lower leg: 3+ Edema present.  Neurological:     Mental Status: He is alert and oriented to person, place, and time. Mental status is at baseline.      UC Treatments / Results  Labs (all labs ordered are listed, but only  abnormal results are  displayed) Labs Reviewed - No data to display  EKG   Radiology No results found.  Procedures Procedures (including critical care time)  Medications Ordered in UC Medications - No data to display  Initial Impression / Assessment and Plan / UC Course  I have reviewed the triage vital signs and the nursing notes.  Pertinent labs & imaging results that were available during my care of the patient were reviewed by me and considered in my medical decision making (see chart for details).  Shortness of breath, leg swelling  Blood pressure slightly elevated at 156/100, EKG shows atrial fibrillation with a heart rate of 74, stable, lungs are clear to auscultation, chest x-ray pending, unknown etiology of symptoms, will make treatment plan based on chest x-ray results urinalysis pending Final Clinical Impressions(s) / UC Diagnoses   Final diagnoses:  SOB (shortness of breath)  Leg swelling     Discharge Instructions      Today you have been evaluated for shortness of breath and leg swelling  EKG shows that your heart is beating irregularly which is consistent with atrial fibrillation however your rate is controlled low suspicion that this is the cause of your symptoms  Chest x-ray is pending, will discuss treatment plan with you based on results   ED Prescriptions   None    PDMP not reviewed this encounter.   Valinda Hoar, NP 07/12/23 1430

## 2023-07-12 NOTE — Discharge Instructions (Addendum)
Today you have been evaluated for shortness of breath and leg swelling  EKG shows that your heart is beating irregularly which is consistent with atrial fibrillation however your rate is controlled low suspicion that this is the cause of your symptoms  Chest x-ray is pending, will discuss treatment plan with you based on results

## 2024-09-06 ENCOUNTER — Telehealth: Payer: Self-pay | Admitting: Family

## 2024-09-06 NOTE — Telephone Encounter (Signed)
 Error
